# Patient Record
Sex: Female | Born: 1960 | Race: Black or African American | Hispanic: No | Marital: Single | Smoking: Never smoker
Health system: Southern US, Community
[De-identification: ages and names within clinical notes are randomized; demographics above are authoritative.]

## PROBLEM LIST (undated history)

## (undated) DIAGNOSIS — D649 Anemia, unspecified: Secondary | ICD-10-CM

## (undated) DIAGNOSIS — D219 Benign neoplasm of connective and other soft tissue, unspecified: Secondary | ICD-10-CM

## (undated) DIAGNOSIS — I10 Essential (primary) hypertension: Secondary | ICD-10-CM

## (undated) HISTORY — DX: Benign neoplasm of connective and other soft tissue, unspecified: D21.9

## (undated) HISTORY — PX: CYST EXCISION: SHX5701

---

## 2011-05-02 ENCOUNTER — Emergency Department (HOSPITAL_COMMUNITY)
Admission: EM | Admit: 2011-05-02 | Discharge: 2011-05-02 | Disposition: A | Discharge status: Home / Self Care | Payer: Self-pay | Attending: Emergency Medicine | Admitting: Emergency Medicine

## 2011-05-02 ENCOUNTER — Emergency Department (HOSPITAL_COMMUNITY): Payer: Self-pay

## 2011-05-02 DIAGNOSIS — R05 Cough: Secondary | ICD-10-CM | POA: Insufficient documentation

## 2011-05-02 DIAGNOSIS — J329 Chronic sinusitis, unspecified: Secondary | ICD-10-CM | POA: Insufficient documentation

## 2011-05-02 DIAGNOSIS — R059 Cough, unspecified: Secondary | ICD-10-CM | POA: Insufficient documentation

## 2013-03-25 ENCOUNTER — Emergency Department (HOSPITAL_COMMUNITY): Payer: BC Managed Care – PPO

## 2013-03-25 ENCOUNTER — Encounter (HOSPITAL_COMMUNITY): Payer: Self-pay

## 2013-03-25 ENCOUNTER — Emergency Department (HOSPITAL_COMMUNITY)
Admission: EM | Admit: 2013-03-25 | Discharge: 2013-03-25 | Disposition: A | Discharge status: Home / Self Care | Payer: BC Managed Care – PPO | Attending: Emergency Medicine | Admitting: Emergency Medicine

## 2013-03-25 DIAGNOSIS — D219 Benign neoplasm of connective and other soft tissue, unspecified: Secondary | ICD-10-CM

## 2013-03-25 DIAGNOSIS — N949 Unspecified condition associated with female genital organs and menstrual cycle: Secondary | ICD-10-CM | POA: Insufficient documentation

## 2013-03-25 DIAGNOSIS — N938 Other specified abnormal uterine and vaginal bleeding: Secondary | ICD-10-CM | POA: Insufficient documentation

## 2013-03-25 DIAGNOSIS — D649 Anemia, unspecified: Secondary | ICD-10-CM | POA: Insufficient documentation

## 2013-03-25 DIAGNOSIS — Z79899 Other long term (current) drug therapy: Secondary | ICD-10-CM | POA: Insufficient documentation

## 2013-03-25 DIAGNOSIS — D252 Subserosal leiomyoma of uterus: Secondary | ICD-10-CM | POA: Insufficient documentation

## 2013-03-25 DIAGNOSIS — I10 Essential (primary) hypertension: Secondary | ICD-10-CM | POA: Insufficient documentation

## 2013-03-25 LAB — URINALYSIS, ROUTINE W REFLEX MICROSCOPIC
Glucose, UA: NEGATIVE mg/dL
Hgb urine dipstick: NEGATIVE
Specific Gravity, Urine: <1.005 — ABNORMAL LOW (ref 1.005–1.030)

## 2013-03-25 LAB — BASIC METABOLIC PANEL
BUN: 14 mg/dL (ref 6–23)
CO2: 25 mEq/L (ref 19–32)
Calcium: 9.4 mg/dL (ref 8.4–10.5)
Creatinine, Ser: 0.89 mg/dL (ref 0.50–1.10)
Glucose, Bld: 114 mg/dL — ABNORMAL HIGH (ref 70–99)

## 2013-03-25 LAB — CBC WITH DIFFERENTIAL/PLATELET
Eosinophils Relative: 2 % (ref 0–5)
HCT: 23.3 % — ABNORMAL LOW (ref 36.0–46.0)
Lymphocytes Relative: 16 % (ref 12–46)
Lymphs Abs: 1.7 10*3/uL (ref 0.7–4.0)
MCV: 80.1 fL (ref 78.0–100.0)
Monocytes Absolute: 0.8 10*3/uL (ref 0.1–1.0)
RBC: 2.91 MIL/uL — ABNORMAL LOW (ref 3.87–5.11)
RDW: 13.1 % (ref 11.5–15.5)
WBC: 10.9 10*3/uL — ABNORMAL HIGH (ref 4.0–10.5)

## 2013-03-25 LAB — PREGNANCY, URINE: Preg Test, Ur: NEGATIVE

## 2013-03-25 MED ORDER — MEGESTROL ACETATE 40 MG PO TABS: 40.0000 mg | ORAL_TABLET | Freq: Three times a day (TID) | ORAL | Status: DC

## 2013-03-25 MED ORDER — FERROUS SULFATE 325 (65 FE) MG PO TABS: 325.0000 mg | ORAL_TABLET | Freq: Two times a day (BID) | ORAL | Status: AC

## 2013-03-25 NOTE — ED Provider Notes (Signed)
History  This chart was scribed for Anita Lyons, MD by [name], ED Scribe. This patient was seen in room APA10/APA10 and the patient's care was started at 2:09 PM.   CSN: [id number]  Arrival date & time 03/25/13  1341   First MD Initiated Contact with Patient 03/25/13 1409      Chief Complaint  Patient presents with  . Abnormal Lab    The history is provided by the patient. No language interpreter was used.    Anita Mosley is a 52 y.o. female brought in by ambulance from [facility], who presents to the Emergency Department complaining of 2 to 3 months of vaginal bleeding described as heavier than normal that has worsened over the past month with associated one week of fatigue, subjective fevers and chills. She reports having to change pads every hour. Hemoglobin was measured 7.3 in her PCP's officer today and she was advised to come to the ED. She denies having a h/o fibroids and denies being followed by a gynecologist currently. She states that she was being seen by health department but has not had any PAP smears done "in a long while".  She also reports starting a new HTN medication one month ago after being seen for eye swelling. Pt has f/u several times since then after being told that her BP was at "stroke-level" and reports several medications changes. She states that she has an appointment with a cardiologist to be checked out in 3 days.  She denies nausea, emesis and diarrhea as associated symptoms. Pt does not have a h/o chronic medical conditions or prior operations.   Dr. Ethelene Browns at [facility]  History reviewed. No pertinent past medical history.  History reviewed. No pertinent past surgical history.  History reviewed. No pertinent family history.  History  Substance Use Topics  . Smoking status: Not on file  . Smokeless tobacco: Not on file  . Alcohol Use: No    No OB history provided.  Review of Systems  A complete 10 system  review of systems was obtained and all systems are negative except as noted in the HPI and PMH.   Allergies  Aspirin  Home Medications  No current outpatient prescriptions on file.  Triage Vitals: BP 171/67  Pulse 111  Temp(Src) 97.4 F (36.3 C) (Oral)  Resp 18  Ht 5\' 2"  (1.575 m)  Wt 196 lb (88.905 kg)  BMI 35.84 kg/m2  SpO2 99%  LMP 01/25/2013  Physical Exam  Nursing note and vitals reviewed. Constitutional: She is oriented to person, place, and time. She appears well-developed and well-nourished. No distress.  HENT:  Head: Normocephalic and atraumatic.  Eyes: Conjunctivae and EOM are normal.  Neck: Neck supple. No tracheal deviation present.  Cardiovascular: Normal rate and regular rhythm.   No murmur heard. Pulmonary/Chest: Effort normal and breath sounds normal. No respiratory distress.  Abdominal: Soft. There is no tenderness.  Musculoskeletal: Normal range of motion. She exhibits no edema.  Neurological: She is alert and oriented to person, place, and time.  Skin: Skin is warm and dry.  Psychiatric: She has a normal mood and affect. Her behavior is normal.    ED Course  Procedures (including critical care time)  DIAGNOSTIC STUDIES: Oxygen Saturation is 99% on room air, normal by my interpretation.    COORDINATION OF CARE: 2:20 PM-Discussed treatment plan which includes [place], UA, CBC and BMP with pt at bedside and pt agreed to plan.   Labs Reviewed  CBC  WITH DIFFERENTIAL - Abnormal; Notable for the following:    WBC 10.9 (*)    RBC 2.91 (*)    Hemoglobin 7.3 (*)    HCT 23.3 (*)    MCH 25.1 (*)    Platelets 437 (*)    Neutro Abs 8.2 (*)    All other components within normal limits  BASIC METABOLIC PANEL - Abnormal; Notable for the following:    Sodium 133 (*)    Chloride 95 (*)    Glucose, Bld 114 (*)    GFR calc non Af Amer 75 (*)    GFR calc Af Amer 87 (*)    All other components within normal limits  URINALYSIS, ROUTINE W REFLEX MICROSCOPIC -  Abnormal; Notable for the following:    Specific Gravity, Urine <1.005 (*)    All other components within normal limits  PREGNANCY, URINE  TYPE AND SCREEN   US Transvaginal Non-ob  03/25/2013  *RADIOLOGY REPORT*  Clinical Data: Vaginal bleeding.  Abnormal labs.  Hemoglobin is 7.3.  LMP 01/19/2013.  TRANSABDOMINAL AND TRANSVAGINAL ULTRASOUND OF PELVIS Technique:  Both transabdominal and transvaginal ultrasound examinations of the pelvis were performed. Transabdominal technique was performed for global imaging of the pelvis including uterus, ovaries, adnexal regions, and pelvic cul-de-sac.  It was necessary to proceed with endovaginal exam following the transabdominal exam to visualize the cervix and cul-de-sac region, lower uterine segment and endometrial stripe.  Comparison:  None  Findings:  Uterus: Uterus is enlarged, measuring 14.2 x 8.9 x 8.6 cm. Multiple fibroids are identified.  Central fibroid is 4.9 x 3.3 x 4.9 cm and is likely submucosal. The endometrial stripe is displaced anteriorly by this mass. Anterior fibroids are 1.6 x 2.0 x 1.6 cm and 3.7 x 3.2 x 3.6 cm.  Endometrium: 3.8 mm in thickness.  Displaced anteriorly.  Right ovary:  3.5 x 2.0 x 2.9 cm. Normal appearance  Left ovary: 3.2 x 1.5 x 2.0 cm.  Normal appearance.Area  Other findings: No free fluid  IMPRESSION:  1.  Enlarged uterus containing multiple fibroids. 2.  Central fibroid displaces the endometrial stripe anteriorly. 3.  Normal-appearing ovaries.   Original Report Authenticated By: Norva Pavlov, M.D.    US Pelvis Complete  03/25/2013  *RADIOLOGY REPORT*  Clinical Data: Vaginal bleeding.  Abnormal labs.  Hemoglobin is 7.3.  LMP 01/19/2013.  TRANSABDOMINAL AND TRANSVAGINAL ULTRASOUND OF PELVIS Technique:  Both transabdominal and transvaginal ultrasound examinations of the pelvis were performed. Transabdominal technique was performed for global imaging of the pelvis including uterus, ovaries, adnexal regions, and pelvic cul-de-sac.   It was necessary to proceed with endovaginal exam following the transabdominal exam to visualize the cervix and cul-de-sac region, lower uterine segment and endometrial stripe.  Comparison:  None  Findings:  Uterus: Uterus is enlarged, measuring 14.2 x 8.9 x 8.6 cm. Multiple fibroids are identified.  Central fibroid is 4.9 x 3.3 x 4.9 cm and is likely submucosal. The endometrial stripe is displaced anteriorly by this mass. Anterior fibroids are 1.6 x 2.0 x 1.6 cm and 3.7 x 3.2 x 3.6 cm.  Endometrium: 3.8 mm in thickness.  Displaced anteriorly.  Right ovary:  3.5 x 2.0 x 2.9 cm. Normal appearance  Left ovary: 3.2 x 1.5 x 2.0 cm.  Normal appearance.Area  Other findings: No free fluid  IMPRESSION:  1.  Enlarged uterus containing multiple fibroids. 2.  Central fibroid displaces the endometrial stripe anteriorly. 3.  Normal-appearing ovaries.   Original Report Authenticated By: Norva Pavlov, M.D.  No diagnosis found.    MDM  The patient presents here with vaginal bleeding, anemia with Hb 7.3.  I ordered an ultrasound which showed fibroids.  I have discussed this with Dr. Emelda Fear from [facility] who came to the ED to see the patient.  His recommendations are for megace, mvit, and iron supplements.  Will discharge to home with the prescriptions.  To follow up with Dr. Emelda Fear in the office.    I personally performed the services described in this documentation, which was scribed in my presence. The recorded information has been reviewed and is accurate.          Anita Lyons, MD 03/25/13 [id number]

## 2013-03-25 NOTE — ED Notes (Signed)
Pt via [facility] from [facility] due to low hemoglobin. States she has been menstruating for 1 month

## 2013-03-25 NOTE — ED Notes (Addendum)
Pt with vaginal bleeding with large clots per pt x 3 months per pt., denies pain, + dizziness with positional changes

## 2013-03-27 ENCOUNTER — Telehealth: Payer: Self-pay | Admitting: Obstetrics and Gynecology

## 2013-04-04 NOTE — Telephone Encounter (Signed)
Please call patient to speed up referral from  ED where she was seen with anemia and fibriods.  If pt has other f/u plans elsewhere, that's fine, just document.

## 2013-04-12 ENCOUNTER — Encounter: Payer: Self-pay | Admitting: Obstetrics and Gynecology

## 2013-04-12 ENCOUNTER — Ambulatory Visit (INDEPENDENT_AMBULATORY_CARE_PROVIDER_SITE_OTHER): Payer: BC Managed Care – PPO | Admitting: Obstetrics and Gynecology

## 2013-04-12 VITALS — BP 160/68 | Wt 212.2 lb

## 2013-04-12 DIAGNOSIS — N938 Other specified abnormal uterine and vaginal bleeding: Secondary | ICD-10-CM

## 2013-04-12 DIAGNOSIS — D500 Iron deficiency anemia secondary to blood loss (chronic): Secondary | ICD-10-CM

## 2013-04-12 DIAGNOSIS — D649 Anemia, unspecified: Secondary | ICD-10-CM

## 2013-04-12 DIAGNOSIS — N949 Unspecified condition associated with female genital organs and menstrual cycle: Secondary | ICD-10-CM

## 2013-04-12 DIAGNOSIS — D259 Leiomyoma of uterus, unspecified: Secondary | ICD-10-CM

## 2013-04-12 LAB — POCT HEMOGLOBIN: Hemoglobin: 7.4 g/dL — AB (ref 12.2–16.2)

## 2013-04-12 NOTE — Patient Instructions (Addendum)
.    You have info on fibriods

## 2013-04-12 NOTE — Addendum Note (Signed)
Addended by: Colen Darling on: 04/12/2013 01:26 PM   Modules accepted: Orders

## 2013-04-12 NOTE — Progress Notes (Addendum)
  Subjective:    Anita Mosley is a 52 y.o. female who presents with uterine fibroids. Periods are light ,were heavy until seen in ED, lasting several days. Dysmenorrhea:none. Occasional intermenstrual bleeding, spotting, or discharge.  Current contraception: none abstinence History of abnormal Pap smear: no Family history of uterine or ovarian cancer: no Regular self breast exam: yes History of abnormal mammogram: never obtained Family history of breast cancer: yes - mother History of abnormal lipids: no Menstrual History: OB History   Grav Para Term Preterm Abortions TAB SAB Ect Mult Living                  Menarche age:  Patient's last menstrual period was 01/25/2013.      Review of Systems Pertinent items are noted in HPI.    Objective:     BP 160/68  Wt 212 lb 3.2 oz (96.253 kg)  BMI 38.8 kg/m2  LMP 01/25/2013 BP 160/68  Wt 212 lb 3.2 oz (96.253 kg)  BMI 38.8 kg/m2  LMP 01/25/2013 General appearance: alert and no distress Abdomen: abnormal findings:  fibroid enlargement Pelvic: prolapsed endometrial polyp, consent obtained verbally, removed easily  7 cm Extremities: extremities normal, atraumatic, no cyanosis or edema    Assessment:    Symptomatic uterine fibroids.  Endometrial Polyp, removed   Plan:  followup tissue, obtain pap at [facility], return here 1 month for next plan.   Will need to retest for GC/Chl as todays specimen  Not processed due to collection error

## 2013-04-15 LAB — GC/CHLAMYDIA PROBE AMP

## 2013-04-23 ENCOUNTER — Ambulatory Visit (INDEPENDENT_AMBULATORY_CARE_PROVIDER_SITE_OTHER): Payer: BC Managed Care – PPO | Admitting: Obstetrics and Gynecology

## 2013-04-23 DIAGNOSIS — Z01818 Encounter for other preprocedural examination: Secondary | ICD-10-CM

## 2013-04-23 DIAGNOSIS — D259 Leiomyoma of uterus, unspecified: Secondary | ICD-10-CM

## 2013-04-24 LAB — GC/CHLAMYDIA PROBE AMP: CT Probe RNA: NEGATIVE

## 2013-05-06 ENCOUNTER — Encounter: Payer: Self-pay | Admitting: *Deleted

## 2013-05-06 NOTE — Telephone Encounter (Signed)
Error

## 2013-05-10 ENCOUNTER — Other Ambulatory Visit: Payer: Self-pay | Admitting: Obstetrics and Gynecology

## 2013-05-10 ENCOUNTER — Encounter: Payer: Self-pay | Admitting: Obstetrics and Gynecology

## 2013-05-10 ENCOUNTER — Ambulatory Visit (INDEPENDENT_AMBULATORY_CARE_PROVIDER_SITE_OTHER): Payer: BC Managed Care – PPO | Admitting: Obstetrics and Gynecology

## 2013-05-10 ENCOUNTER — Other Ambulatory Visit (HOSPITAL_COMMUNITY)
Admission: RE | Admit: 2013-05-10 | Discharge: 2013-05-10 | Disposition: A | Discharge status: Home / Self Care | Payer: BC Managed Care – PPO | Source: Ambulatory Visit | Attending: Obstetrics and Gynecology | Admitting: Obstetrics and Gynecology

## 2013-05-10 VITALS — BP 130/68 | Ht 61.0 in | Wt 212.4 lb

## 2013-05-10 DIAGNOSIS — Z1151 Encounter for screening for human papillomavirus (HPV): Secondary | ICD-10-CM | POA: Insufficient documentation

## 2013-05-10 DIAGNOSIS — Z01419 Encounter for gynecological examination (general) (routine) without abnormal findings: Secondary | ICD-10-CM | POA: Insufficient documentation

## 2013-05-10 DIAGNOSIS — Z3200 Encounter for pregnancy test, result unknown: Secondary | ICD-10-CM

## 2013-05-10 DIAGNOSIS — N938 Other specified abnormal uterine and vaginal bleeding: Secondary | ICD-10-CM

## 2013-05-10 DIAGNOSIS — Z3202 Encounter for pregnancy test, result negative: Secondary | ICD-10-CM

## 2013-05-10 DIAGNOSIS — N939 Abnormal uterine and vaginal bleeding, unspecified: Secondary | ICD-10-CM

## 2013-05-10 DIAGNOSIS — N949 Unspecified condition associated with female genital organs and menstrual cycle: Secondary | ICD-10-CM

## 2013-05-10 NOTE — Patient Instructions (Signed)
Results in 1 week on biopsy

## 2013-05-27 ENCOUNTER — Telehealth: Payer: Self-pay | Admitting: Obstetrics and Gynecology

## 2013-05-28 NOTE — Telephone Encounter (Signed)
Pt states been having nausea and weakness, vaginal bleeding with clots since procedure/endometrial biopsy with Dr. Emelda Fear. Appointment made with Dr. Emelda Fear tomorrow at 9 am.

## 2013-05-28 NOTE — Telephone Encounter (Signed)
Spoke with Joaquin Courts pt niece, she stated would have pt call office back to discuss questions and concerns.

## 2013-05-29 ENCOUNTER — Ambulatory Visit (INDEPENDENT_AMBULATORY_CARE_PROVIDER_SITE_OTHER): Payer: BC Managed Care – PPO | Admitting: Obstetrics and Gynecology

## 2013-05-29 ENCOUNTER — Ambulatory Visit: Payer: BC Managed Care – PPO | Admitting: Obstetrics and Gynecology

## 2013-05-29 ENCOUNTER — Encounter: Payer: Self-pay | Admitting: Obstetrics and Gynecology

## 2013-05-29 VITALS — BP 120/60 | Ht 61.0 in | Wt 212.0 lb

## 2013-05-29 DIAGNOSIS — N939 Abnormal uterine and vaginal bleeding, unspecified: Secondary | ICD-10-CM

## 2013-05-29 DIAGNOSIS — D259 Leiomyoma of uterus, unspecified: Secondary | ICD-10-CM

## 2013-05-29 DIAGNOSIS — D649 Anemia, unspecified: Secondary | ICD-10-CM

## 2013-05-29 DIAGNOSIS — N921 Excessive and frequent menstruation with irregular cycle: Secondary | ICD-10-CM | POA: Insufficient documentation

## 2013-05-29 DIAGNOSIS — N920 Excessive and frequent menstruation with regular cycle: Secondary | ICD-10-CM

## 2013-05-29 DIAGNOSIS — N898 Other specified noninflammatory disorders of vagina: Secondary | ICD-10-CM

## 2013-05-29 MED ORDER — MEGESTROL ACETATE 40 MG PO TABS: 40.0000 mg | ORAL_TABLET | Freq: Three times a day (TID) | ORAL | Status: DC

## 2013-05-29 NOTE — Progress Notes (Signed)
  Assessment:  persistent menometrorrhagia due to fibroids, submucosal Anemia, severe secondary to the menorrhagia Desire to avoid transfusion Desire for hysterectomy   Plan:  counseled on treatment with Megace to suppress bleeding til she can build up Hgb for surgery Rx: Megace  40 mg 3 times a day until bleeding stops then once daily until anemia recovered and surgery to be scheduled return visit 2 weeks 23 June -Pt to call if not dramatically reduced bleeding by 1 wk, will move forward with surgery and transfusions. Subjective:  KHYLI SWAIM is a 52 y.o. female who has been referred here for bleeding due to fibroids and endometrial polyp. Polyp was excised 4/25 [facility], and followup 5/6 reviewed path, performed GC/Chl, reviewed u/s from hospital showing fibroids intra mural and submucosal, sufficient to interfere with endometrium. She was treated with megace to attempt to control the endometrium  On 5/25, followup involved completion of Pap smear and endometrial biopsy.   Today, 6/11, she presents for followup of the persistent heavy bleeding associated with fibroids that has increased recently once again.  She notices symptoms of anemia, increased fatigue with exertion. She still wished to avoid transfusions: we discussed Megace, which helped before, and/or lupron as well as transfusing pt and proceeding to hysterectomy.  The following portions of the patient's history were reviewed and updated as appropriate: allergies, current medications, past medical & surgical history, & past family history.   Patient's mother had breast cancer at premenopausal age patient strongly advised to begin getting mammograms    Review of Systems Pertinent items are noted in HPI. Breast:Negative for breast lump,nipple discharge or nipple retraction Gastrointestinal: Negative for abdominal pain, change in bowel habits or rectal bleeding GU: Negative for dysuria, frequency, urgency or incontinence.   GYN:  Patient's last menstrual period was 05/10/2013.  continued bleeding to this date On 5/23 she had pa Objective:  BP 120/60  Ht 5\' 1"  (1.549 m)  Wt 212 lb (96.163 kg)  BMI 40.08 kg/m2  LMP 05/10/2013    BMI: Body mass index is 40.08 kg/(m^2).  General Appearance: Alert, appropriate appearance for age. 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 S1, S2, no murmur Lungs: Clear to auscultation bilaterally Back: No CVA tenderness Gastrointestinal: Soft, non-tender, no masses or organomegaly Pelvic Exam: Exam deferred. External genitalia: normal general appearance Vaginal: normal rugae, presence of blood and And clots Cervix: normal appearance Adnexa: Uterus enlarged, deep and pelvis, with no adnexal tenderness. Estimated uterine size 12 weeks Uterus: mid-position, enlarged and 12 week Good support not a vaginal hysterectomy candidate Rectovaginal: not indicated  Tilda Burrow MD

## 2013-05-29 NOTE — Progress Notes (Deleted)
   [facility]  Patient name: Anita Mosley MRN [id number]  Date of birth: [birth year]  CC & HPI:  Anita Mosley is a 52 y.o. female presenting today for ***  ROS:  ***  Pertinent History Reviewed:  Medical & Surgical Hx:  Reviewed: Significant for *** Medications: Reviewed & Updated - see associated section Social History: Reviewed -  reports that she has never smoked. She does not have any smokeless tobacco history on file.  Objective Findings:  Vitals: BP 130/68  Ht 5\' 1"  (1.549 m)  Wt 212 lb 6.4 oz (96.344 kg)  BMI 40.15 kg/m2  LMP 05/10/2013  Physical Examination: {female adult master:[id number]}  ***  Assessment & Plan:

## 2013-05-29 NOTE — Patient Instructions (Signed)
Take Megace 3 times daily until the bleeding stops. Please call our office and let us know if bleeding does not stop within a week there is an injection called Lupron that may work if the Megace is in effective

## 2013-05-30 NOTE — Progress Notes (Signed)
   Assessment:   persistent menometrorrhagia due to fibroids, submucosal  Anemia, severe secondary to the menorrhagia  Desire to avoid transfusion  Desire for hysterectomy  Plan:  Followup Biopsy and Pap  By appt to finalize surgery plans Continue iron, vitamins   Subjective:  Anita Mosley is a 52 y.o. female, No obstetric history on file., who presents for pap and endometrial biopsy, as completion of preop w/u Review of records did not id a recent pap.  The following portions of the patient's history were reviewed and updated as appropriate: allergies, current medications, past medical & surgical history, & past family history.   There is no significant family history of breast or ovarian cancer.    Review of Systems Pertinent items are noted in HPI. Breast:Negative for breast lump,nipple discharge or nipple retraction Gastrointestinal: Negative for abdominal pain, change in bowel habits or rectal bleeding GU: Negative for dysuria, frequency, urgency or incontinence.   GYN: Patient's last menstrual period was 05/10/2013.   Objective:  BP 130/68  Ht 5\' 1"  (1.549 m)  Wt 212 lb 6.4 oz (96.344 kg)  BMI 40.15 kg/m2  LMP 05/10/2013    BMI: Body mass index is 40.15 kg/(m^2).  General Appearance: Alert, appropriate appearance for age. No acute distress HEENT: Grossly normal Neck / Thyroid: Supple, no masses, nodes or enlargement Cardiovascular: Regular rate and rhythm. S1, S2, no murmur Lungs: Clear to auscultation bilaterally Back: No CVA tenderness Gastrointestinal: Soft, non-tender, no masses or organomegaly Pelvic Exam: Vaginal: normal mucosa without prolapse or lesions, presence of blood and minimal blood this date Cervix: normal appearance and presence of blood from cervical os Adnexa: normal bimanual exam Uterus: retroverted, enlarged, irregular enlargement and sounded to 10 cm for endometrial biopsy . Rectovaginal: not indicated  Tilda Burrow MD  Endometrial  Biopsy: Patient given informed consent, signed copy in the chart, time out was performed. Time out taken. . The patient was placed in the lithotomy position and the cervix brought into view with sterile speculum.  Portio of cervix cleansed x 2 with betadine swabs.  A tenaculum was placed in the anterior lip of the cervix. The uterus was sounded for depth of 10 cm,. Milex uterine Explora 3 mm was introduced to into the uterus, suction created,  and an endometrial sample was obtained. All equipment was removed and accounted for.   The patient tolerated the procedure well.    Patient given post procedure instructions. The patient will return in 2 weeks for results.

## 2013-06-03 ENCOUNTER — Ambulatory Visit: Payer: BC Managed Care – PPO | Admitting: Obstetrics and Gynecology

## 2013-06-10 ENCOUNTER — Encounter: Payer: Self-pay | Admitting: Obstetrics and Gynecology

## 2013-06-10 ENCOUNTER — Ambulatory Visit (INDEPENDENT_AMBULATORY_CARE_PROVIDER_SITE_OTHER): Payer: BC Managed Care – PPO | Admitting: Obstetrics and Gynecology

## 2013-06-10 VITALS — BP 160/70 | Ht 62.0 in | Wt 211.1 lb

## 2013-06-10 DIAGNOSIS — D259 Leiomyoma of uterus, unspecified: Secondary | ICD-10-CM

## 2013-06-10 DIAGNOSIS — D649 Anemia, unspecified: Secondary | ICD-10-CM

## 2013-06-10 LAB — POCT HEMOGLOBIN: Hemoglobin: 9.1 g/dL — AB (ref 12.2–16.2)

## 2013-06-10 NOTE — Patient Instructions (Addendum)
Continue iron and vitamins until we see her back in 2 weeks. Surgery will be 29 July and will be a hysterectomy with removal of the tubes. We will save the ovaries. The cervix will be removed. If you have any concerns or feel the need to change the surgery , let us know. Please review the information regarding hysterectomy

## 2013-06-10 NOTE — Progress Notes (Signed)
   [facility] Visit  Patient name: Anita Mosley MRN [id number]  Date of birth: 1961-10-21  CC & HPI:  Anita Mosley is a 52 y.o. female presenting today for followup of endometrial biopsy, which was benign and Pap smear which has returned normal she is status post removal of parotid tumor on 05/30/2013 at [facility] and is healing from that surgery. The parotid tumor was benign we will delay hysterectomy until she's had time to recover for a little but further from the parotid tumor see her back in 2 weeks. Bleeding is much improved on Megace. We'll keep her  on Megace until the surgery. We have discussed surgical types and we'll proceed with total abdominal hysterectomy with bilateral salpingectomy, and we'll leave both ovaries in place. The family and the patient desired the surgical schedule for July 29. She no longer is interested in avoiding a hysterectomy I think is necessary  ROS:  Healing well status post parotid tumor excision  Pertinent History Reviewed:  Medical & Surgical Hx:  Reviewed: Significant for surgery July 9 [place] left parotid tumor Medications: Reviewed & Updated - see associated section Social History: Reviewed -  reports that she has never smoked. She does not have any smokeless tobacco history on file.  Objective Findings:  Vitals: BP 160/70  Ht 5\' 2"  (1.575 m)  Wt 211 lb 1.6 oz (95.754 kg)  BMI 38.6 kg/m2  LMP 05/10/2013  Physical Examination: General appearance - alert, well appearing, and in no distress, oriented to person, place, and time and overweight Mental status - alert, oriented to person, place, and time, normal mood, behavior, speech, dress, motor activity, and thought processes  HEENT: Z-plasty incision in front and around the left ear, sutures present, healing well but obviously early in healing process  Assessment & Plan:   Uterine fibroids with secondary menometrorrhagia Submucosal uterine fibroid Anemia Satisfactory  response to Megace Status post parotid tumor excision July 9  Plan: Check hemoglobin today, hemoglobin fingerstick 9.1 improved from 7 earlier Allow patient to come back in 2 weeks for full preop examination Abdominal hysterectomy with salpingectomy, preservation of ovaries and removal of cervix scheduled for July 29 Patient taken to office scheduler, Dawn Little for scheduling of surgery

## 2013-06-20 ENCOUNTER — Encounter: Payer: Self-pay | Admitting: Obstetrics and Gynecology

## 2013-06-20 NOTE — Progress Notes (Signed)
   [facility] Visit  Patient name: LAMIAH MARMOL MRN [id number]  Date of birth: [birth year]  CC & HPI:  CHARRISE LARDNER is a 52 y.o. female presenting today for followup of last visit. Need repeat of GC /Chl,  Specimen was not processed correctly at last visit  ROS:  See prior note  Pertinent History Reviewed:  Medical & Surgical Hx:  Reviewed: Significant for  Medications: Reviewed & Updated - see associated section Social History: Reviewed -  reports that she has never smoked. She does not have any smokeless tobacco history on file.  Objective Findings:  Vitals: LMP 01/25/2013  Physical Examination: Pelvic - normal external genitalia, vulva, vagina, cervix, uterus and adnexa, VULVA: normal appearing vulva with no masses, tenderness or lesions Uterine fibroids 14 weeks size   Assessment & Plan:  A: Uterine fibroids 14 wks       Need to recheck gc/chl F/u GC/Chl Return for endometrial biopsy Complete preop w/u

## 2013-06-24 ENCOUNTER — Ambulatory Visit (INDEPENDENT_AMBULATORY_CARE_PROVIDER_SITE_OTHER): Payer: BC Managed Care – PPO | Admitting: Obstetrics and Gynecology

## 2013-06-24 ENCOUNTER — Encounter: Payer: Self-pay | Admitting: Obstetrics and Gynecology

## 2013-06-24 VITALS — BP 140/80 | Ht 62.0 in | Wt 211.6 lb

## 2013-06-24 DIAGNOSIS — D259 Leiomyoma of uterus, unspecified: Secondary | ICD-10-CM

## 2013-06-24 DIAGNOSIS — D649 Anemia, unspecified: Secondary | ICD-10-CM

## 2013-06-24 NOTE — Progress Notes (Signed)
   [facility] Visit  Patient name: Anita Mosley MRN [id number]  Date of birth: 1961-09-04  CC & HPI:  Anita Mosley is a 52 y.o. female presenting today for preOP. Scheduled Total abd hyst for 29 July. Pt has healed nicely s/p recent parotid tumor removal at [facility] ROS:  Patient is here with her niece, who is the disiplined one The patient likes to have an anxiety reaction and put off surgery medical interventions. She put off the parotid surgery for 2 years, and described herself as someone who does not want any pain.  Pertinent History Reviewed:  Medical & Surgical Hx:  Reviewed: Significant for status post parotid tumor excision, healing well Medications: Reviewed & Updated - see associated section Social History: Reviewed -  reports that she has never smoked. She does not have any smokeless tobacco history on file.  Objective Findings:  Vitals: BP 140/80  Ht 5\' 2"  (1.575 m)  Wt 211 lb 9.6 oz (95.981 kg)  BMI 38.69 kg/m2  LMP 05/10/2013  Physical Examination: General appearance - alert, well appearing, and in no distress, oriented to person, place, and time, overweight and anxious Mental status - alert, oriented to person, place, and time, affect appropriate to mood Ears - bilateral TM's and external ear canals normal, right ear normal, skin around left ear healing nicely after excision of parotid tumor Mouth - mucous membranes moist, pharynx normal without lesions Neck - supple, no significant adenopathy, healing nicely around left ear Chest: Clear to auscultation Abdomen: Obese without masses Pelvic: External genitalia: Normal female appropriate for age           Vaginal normal secretions cervix normal in appearance           Uterus: Exam limited obesity, uterus feels approximately 10-12 week size nontender nonpurulent Adnexa limited by obesity, no adnexal pathology identified. Prior ultrasound showed normal left and right ovary.   Assessment & Plan:   Uterine fibroids, including submucosal fibroid deforming endometrial cavity Benign endometrium and benign endometrial polyp on prior biopsy Anemia, recovering, currently hemoglobin 9.9 Plan: Proceed toward total abdominal hysterectomy bilateral salpingectomy as scheduled 07/16/2013

## 2013-06-24 NOTE — H&P (Signed)
  CC & HPI:   Anita Mosley is a 52 y.o. female presenting today for preOP.  Scheduled Total abd hyst for 29 July.  Pt has healed nicely s/p recent parotid tumor removal at [facility]   ROS:   Patient is here with her niece, who is the disiplined one The patient likes to have an anxiety reaction and put off surgery medical interventions. She put off the parotid surgery for 2 years, and described herself as someone who does not want any pain.   Pertinent History Reviewed:   Medical & Surgical Hx: Reviewed: Significant for status post parotid tumor excision, healing well  Medications: Reviewed & Updated - see associated section  Social History: Reviewed - reports that she has never smoked. She does not have any smokeless tobacco history on file.   Objective Findings:   Vitals: BP 140/80  Ht 5\' 2"  (1.575 m)  Wt 211 lb 9.6 oz (95.981 kg)  BMI 38.69 kg/m2  LMP 05/10/2013  Physical Examination: General appearance - alert, well appearing, and in no distress, oriented to person, place, and time, overweight and anxious  Mental status - alert, oriented to person, place, and time, affect appropriate to mood  Ears - bilateral TM's and external ear canals normal, right ear normal, skin around left ear healing nicely after excision of parotid tumor  Mouth - mucous membranes moist, pharynx normal without lesions  Neck - supple, no significant adenopathy, healing nicely around left ear  Chest: Clear to auscultation  Abdomen: Obese without masses  Pelvic: External genitalia: Normal female appropriate for age  Vaginal normal secretions cervix normal in appearance  Uterus: Exam limited obesity, uterus feels approximately 10-12 week size nontender nonpurulent  Adnexa limited by obesity, no adnexal pathology identified. Prior ultrasound showed normal left and right ovary.   Assessment & Plan:   Uterine fibroids, including submucosal fibroid deforming endometrial cavity  Benign endometrium and benign  endometrial polyp on prior biopsy  Anemia, recovering, currently hemoglobin 9.9  Plan: Proceed toward total abdominal hysterectomy bilateral salpingectomy as scheduled 07/16/2013

## 2013-06-24 NOTE — Patient Instructions (Signed)
Keep preop appt as scheduled

## 2013-06-27 ENCOUNTER — Other Ambulatory Visit (HOSPITAL_COMMUNITY): Payer: BC Managed Care – PPO

## 2013-07-10 ENCOUNTER — Other Ambulatory Visit: Payer: Self-pay | Admitting: Obstetrics and Gynecology

## 2013-07-10 ENCOUNTER — Encounter (HOSPITAL_COMMUNITY): Payer: Self-pay | Admitting: Pharmacy Technician

## 2013-07-10 NOTE — H&P (Signed)
Anita Mosley is an 52 y.o. female. Anita Mosley is scheduled for abdominal hysterectomy and bilateral salpingectomy with preservation of the ovaries on 07/16/2013. The patient has experienced severe anemia due to heavy menses which is attributed to the large submucosal fibroid which is deforming the endometrial cavity and making lesser procedures such as endometrial ablation unlikely to be effective Bleeding has been controlled with the use of Megace, endometrial biopsy has been performed, showing benign endometrium. Pap smear is normal, 05/10/2013. The pros and cons of ovarian preservation have been discussed. The uterus is enlarged with 14.2 x 8.9 x 8.6 cm uterine size with multiple fibroids including a 5 cm central fibroid submucosal in location. Ovaries are normal in appearance bilateral Surgery has been delayed well patient recovered her anemia, and recovered from her recent parotid surgery on the left at [facility].  Pertinent Gynecological History: Menses: flow is excessive with use of Both pads or tampons on heaviest days Bleeding: Excessive heavy menstrual flow Contraception: abstinence DES exposure: unknown Blood transfusions: Transfused prior to parotid surgery as well as after the surgery at [facility] Sexually transmitted diseases: no past history and negative GC Chlamydia culture in May Previous GYN Procedures: Benign endometrial biopsy in 05/10/2013  Last mammogram:  Date:  Last pap: normal Date: May 2014 OB History: G, P   Menstrual History: Menarche age:  Patient's last menstrual period was 05/10/2013.    Past Medical History  Diagnosis Date  . Fibroids     No past surgical history on file.  Family History  Problem Relation Age of Onset  . Cancer Mother     breast  . Diabetes Father   . Hypertension Father     Social History:  reports that she has never smoked. She does not have any smokeless tobacco history on file. She reports that she does not drink alcohol or  use illicit drugs.  Allergies:  Allergies  Allergen Reactions  . Aspirin Diarrhea and Other (See Comments)    Fever, upset stomach  . Mushroom Extract Complex Diarrhea and Other (See Comments)    Fever, upset stomach     (Not in a hospital admission)  ROS  Last menstrual period 05/10/2013. Physical Exam  Physical Examination: General appearance - alert, well appearing, and in no distress, oriented to person, place, and time and anxious Mental status - alert, oriented to person, place, and time, anxious, acknowledges understanding of planned surgery and need for hysterectomy Eyes - pupils equal and reactive, extraocular eye movements intact Neck - supple, no significant adenopathy, well-healed left side of face and neck status post recent parotid surgery at [facility] Chest - clear to auscultation, no wheezes, rales or rhonchi, symmetric air entry Heart - normal rate and regular rhythm Abdomen - soft, nontender, nondistended, no masses or organomegaly Obese Pelvic - VULVA: normal appearing vulva with no masses, tenderness or lesions, VAGINA: normal appearing vagina with normal color and discharge, no lesions, CERVIX: normal appearing cervix without discharge or lesions, UTERUS: anteverted, enlarged to 12-14 weeks size, irregular, ADNEXA: normal adnexa in size, nontender and no masses, fibroid uterus limits exam but ultrasound has been performed and shows normal ovaries, PAP: Normal in May Extremities - peripheral pulses normal, no pedal edema, no clubbing or cyanosis  No results found for this or any previous visit (from the past 24 hour(s)).  No results found.  Assessment/Plan: 1 symptomatic uterine fibroids, submucosal in location with subsequent menometrorrhagia and anemia 2. Hypertension 3. Obesity 4. Status post benign  parotid tumor removal  Plan: Total abdominal hysterectomy with removal of cervix and removal of both tubes, with planned ovarian  preservation.  Kevontay Burks V 07/10/2013, 7:31 AM

## 2013-07-11 ENCOUNTER — Encounter (HOSPITAL_COMMUNITY)
Admission: RE | Admit: 2013-07-11 | Discharge: 2013-07-11 | Disposition: A | Discharge status: Home / Self Care | Payer: BC Managed Care – PPO | Source: Ambulatory Visit | Attending: Obstetrics and Gynecology | Admitting: Obstetrics and Gynecology

## 2013-07-11 ENCOUNTER — Encounter (HOSPITAL_COMMUNITY): Payer: Self-pay

## 2013-07-11 DIAGNOSIS — Z01810 Encounter for preprocedural cardiovascular examination: Secondary | ICD-10-CM | POA: Insufficient documentation

## 2013-07-11 DIAGNOSIS — Z01812 Encounter for preprocedural laboratory examination: Secondary | ICD-10-CM | POA: Insufficient documentation

## 2013-07-11 HISTORY — DX: Essential (primary) hypertension: I10

## 2013-07-11 HISTORY — DX: Anemia, unspecified: D64.9

## 2013-07-11 LAB — COMPREHENSIVE METABOLIC PANEL
ALT: 22 U/L (ref 0–35)
Albumin: 3.7 g/dL (ref 3.5–5.2)
Calcium: 10.5 mg/dL (ref 8.4–10.5)
GFR calc Af Amer: 56 mL/min — ABNORMAL LOW (ref >90)
Glucose, Bld: 86 mg/dL (ref 70–99)
Sodium: 137 mEq/L (ref 135–145)
Total Protein: 7.7 g/dL (ref 6.0–8.3)

## 2013-07-11 LAB — CBC
Hemoglobin: 11.5 g/dL — ABNORMAL LOW (ref 12.0–15.0)
MCH: 26.4 pg (ref 26.0–34.0)
MCHC: 32.4 g/dL (ref 30.0–36.0)
Platelets: 392 10*3/uL (ref 150–400)
RDW: 14.7 % (ref 11.5–15.5)

## 2013-07-11 LAB — URINALYSIS, ROUTINE W REFLEX MICROSCOPIC
Nitrite: NEGATIVE
Specific Gravity, Urine: 1.025 (ref 1.005–1.030)
Urobilinogen, UA: 0.2 mg/dL (ref 0.0–1.0)

## 2013-07-11 LAB — HCG, SERUM, QUALITATIVE: Preg, Serum: NEGATIVE

## 2013-07-11 NOTE — Patient Instructions (Addendum)
Anita Mosley  07/11/2013   Your procedure is scheduled on:  07/16/2013   Report to [facility] at  615  AM.  Call this number if you have problems the morning of surgery: [phone]   Remember:   Do not eat food or drink liquids after midnight.   Take these medicines the morning of surgery with A SIP OF WATER:  Prinizide,claitin, atenolol   Do not wear jewelry, make-up or nail polish.  Do not wear lotions, powders, or perfumes.   Do not shave 48 hours prior to surgery. Men may shave face and neck.  Do not bring valuables to the hospital.  [facility] is not responsible for any belongings or valuables.  Contacts, dentures or bridgework may not be worn into surgery.  Leave suitcase in the car. After surgery it may be brought to your room.  For patients admitted to the hospital, checkout time is 11:00 AM the day of discharge.   Patients discharged the day of surgery will not be allowed to drive home.  Name and phone number of your driver: family  Special Instructions: Shower using CHG 2 nights before surgery and the night before surgery.  If you shower the day of surgery use CHG.  Use special wash - you have one bottle of CHG for all showers.  You should use approximately 1/3 of the bottle for each shower.   Please read over the following fact sheets that you were given: Pain Booklet, Coughing and Deep Breathing, MRSA Information, Surgical Site Infection Prevention, Anesthesia Post-op Instructions and Care and Recovery After Surgery Salpingectomy Salpingectomy, also called tubectomy, is the removal of one of the fallopian tubes with surgery. The fallopian tubes are the tubes that are connected to the uterus. These tubes transport the egg from the ovary to the womb (uterus). Removing one tube does not prevent you from becoming pregnant. Salpingectomy does not have any adverse affects on your menstrual periods. There are many reasons to have a salpingectomy, such as if:  You have a  tubal (ectopic) pregnancy. This is especially true if the tube ruptures.  You have an infected tube.  It is necessary to remove the tube when removing an ovary with a cyst or tumor.  The uterus needs to be removed.  You need surgery for cancer of the tube or other female organs. LET YOUR CAREGIVER KNOW ABOUT:  Allergies to foods or medications.  All the medications you are taking. This includes over-the-counter and prescription drugs, herbs, eye drops, creams and steroids.  If you are using illegal drugs or drinking alcohol.  Your smoking habits.  Previous problems with anesthesia including numbing medication.  The possibility of being pregnant.  History of blood clotting or other blood problems.  Previous surgery.  Other medical or health problems. RISKS AND COMPLICATIONS   Injury to surrounding organs.  Bleeding.  Infection.  The surgery does not correct the problem.  You have problems with the anesthesia. BEFORE THE PROCEDURE  Do not take aspirin or blood thinners. They can cause bleeding.  Do not eat or drink anything at least 8 hours before the surgery.  Let your caregiver know if you develop a cold or an infection.  If you are being admitted the day of surgery, arrive at least one hour before the surgery to read and sign the necessary forms and consents.  Arrange for help when you go home from the hospital.  If you smoke, do not  smoke for at least 2 weeks before the surgery. PROCEDURE  You will be given an IV (intravenous) and a medication to relax you. Then, you will be put to sleep with a drug (anesthetic). Any hair on your lower belly (abdomen) will be removed and a catheter will be placed in your bladder. Through 2 very small cuts (incisions) if you have a laparoscopy, or a large incision in the lower abdomen, the fallopian tube will be removed from where it attaches to the uterus. The blood vessels will be clamped and tied.  AFTER THE PROCEDURE   You  will be taken to the recovery room and observed for 1 to 3 hours.  If you had a laparoscopy, you may be discharged after several hours.  If you had a large incision, you will be admitted to the hospital for a couple of days.  If you had a laparoscopy, you may have shoulder pain. This is not unusual and is from some air that is left in the abdomen. This air affects the nerve that goes from the diaphragm to the shoulder. It goes away in a day or two.  You will be given pain medication if needed.  The intravenous and catheter will be removed before you are discharged.  Have someone available to take you home. Document Released: 04/23/2009 Document Revised: 02/27/2012 Document Reviewed: 04/23/2009 [facility] Patient Information 2014 [place], [place]. Hysterectomy Information  A hysterectomy is a procedure where your uterus is surgically removed. It will no longer be possible to have menstrual periods or to become pregnant. The tubes and ovaries can be removed (bilateral salpingo-oopherectomy) during this surgery as well.  REASONS FOR A HYSTERECTOMY  Persistent, abnormal bleeding.  Lasting (chronic) pelvic pain or infection.  The lining of the uterus (endometrium) starts growing outside the uterus (endometriosis).  The endometrium starts growing in the muscle of the uterus (adenomyosis).  The uterus falls down into the vagina (pelvic organ prolapse).  Symptomatic uterine fibroids.  Precancerous cells.  Cervical cancer or uterine cancer. TYPES OF HYSTERECTOMIES  Supracervical hysterectomy. This type removes the top part of the uterus, but not the cervix.  Total hysterectomy. This type removes the uterus and cervix.  Radical hysterectomy. This type removes the uterus, cervix, and the fibrous tissue that holds the uterus in place in the pelvis (parametrium). WAYS A HYSTERECTOMY CAN BE PERFORMED  Abdominal hysterectomy. A large surgical cut (incision) is made in the abdomen. The uterus  is removed through this incision.  Vaginal hysterectomy. An incision is made in the vagina. The uterus is removed through this incision. There are no abdominal incisions.  Conventional laparoscopic hysterectomy. A thin, lighted tube with a camera (laparoscope) is inserted into 3 or 4 small incisions in the abdomen. The uterus is cut into small pieces. The small pieces are removed through the incisions, or they are removed through the vagina.  Laparoscopic assisted vaginal hysterectomy (LAVH). Three or four small incisions are made in the abdomen. Part of the surgery is performed laparoscopically and part vaginally. The uterus is removed through the vagina.  Robot-assisted laparoscopic hysterectomy. A laparoscope is inserted into 3 or 4 small incisions in the abdomen. A computer-controlled device is used to give the surgeon a 3D image. This allows for more precise movements of surgical instruments. The uterus is cut into small pieces and removed through the incisions or removed through the vagina. RISKS OF HYSTERECTOMY   Bleeding and risk of blood transfusion. Tell your caregiver if you do not want to  receive any blood products.  Blood clots in the legs or lung.  Infection.  Injury to surrounding organs.  Anesthesia problems or side effects.  Conversion to an abdominal hysterectomy. WHAT TO EXPECT AFTER A HYSTERECTOMY  You will be given pain medicine.  You will need to have someone with you for the first 3 to 5 days after you go home.  You will need to follow up with your surgeon in 2 to 4 weeks after surgery to evaluate your progress.  You may have early menopause symptoms like hot flashes, night sweats, and insomnia.  If you had a hysterectomy for a problem that was not a cancer or a condition that could lead to cancer, then you no longer need Pap tests. However, even if you no longer need a Pap test, a regular exam is a good idea to make sure no other problems are  starting. Document Released: 05/31/2001 Document Revised: 02/27/2012 Document Reviewed: 07/16/2011 [facility] Patient Information 2014 [place], [place]. PATIENT INSTRUCTIONS POST-ANESTHESIA  IMMEDIATELY FOLLOWING SURGERY:  Do not drive or operate machinery for the first twenty four hours after surgery.  Do not make any important decisions for twenty four hours after surgery or while taking narcotic pain medications or sedatives.  If you develop intractable nausea and vomiting or a severe headache please notify your doctor immediately.  FOLLOW-UP:  Please make an appointment with your surgeon as instructed. You do not need to follow up with anesthesia unless specifically instructed to do so.  WOUND CARE INSTRUCTIONS (if applicable):  Keep a dry clean dressing on the anesthesia/puncture wound site if there is drainage.  Once the wound has quit draining you may leave it open to air.  Generally you should leave the bandage intact for twenty four hours unless there is drainage.  If the epidural site drains for more than 36-48 hours please call the anesthesia department.  QUESTIONS?:  Please feel free to call your physician or the hospital operator if you have any questions, and they will be happy to assist you.

## 2013-07-15 ENCOUNTER — Encounter: Payer: BC Managed Care – PPO | Admitting: Obstetrics and Gynecology

## 2013-07-16 ENCOUNTER — Encounter (HOSPITAL_COMMUNITY): Payer: Self-pay | Admitting: Anesthesiology

## 2013-07-16 ENCOUNTER — Ambulatory Visit (HOSPITAL_COMMUNITY): Payer: BC Managed Care – PPO | Admitting: Anesthesiology

## 2013-07-16 ENCOUNTER — Encounter (HOSPITAL_COMMUNITY)
Admission: RE | Disposition: A | Discharge status: Home / Self Care | Payer: Self-pay | Source: Ambulatory Visit | Attending: Obstetrics and Gynecology

## 2013-07-16 ENCOUNTER — Encounter (HOSPITAL_COMMUNITY): Payer: Self-pay | Admitting: *Deleted

## 2013-07-16 ENCOUNTER — Inpatient Hospital Stay (HOSPITAL_COMMUNITY)
Admission: RE | Admit: 2013-07-16 | Discharge: 2013-07-18 | DRG: 358 | Disposition: A | Discharge status: Home / Self Care | Payer: BC Managed Care – PPO | Source: Ambulatory Visit | Attending: Obstetrics and Gynecology | Admitting: Obstetrics and Gynecology

## 2013-07-16 DIAGNOSIS — D252 Subserosal leiomyoma of uterus: Secondary | ICD-10-CM

## 2013-07-16 DIAGNOSIS — Z8249 Family history of ischemic heart disease and other diseases of the circulatory system: Secondary | ICD-10-CM

## 2013-07-16 DIAGNOSIS — D250 Submucous leiomyoma of uterus: Secondary | ICD-10-CM

## 2013-07-16 DIAGNOSIS — D251 Intramural leiomyoma of uterus: Secondary | ICD-10-CM

## 2013-07-16 DIAGNOSIS — N838 Other noninflammatory disorders of ovary, fallopian tube and broad ligament: Secondary | ICD-10-CM

## 2013-07-16 DIAGNOSIS — D500 Iron deficiency anemia secondary to blood loss (chronic): Secondary | ICD-10-CM

## 2013-07-16 DIAGNOSIS — Z833 Family history of diabetes mellitus: Secondary | ICD-10-CM

## 2013-07-16 DIAGNOSIS — N920 Excessive and frequent menstruation with regular cycle: Secondary | ICD-10-CM | POA: Diagnosis present

## 2013-07-16 DIAGNOSIS — N72 Inflammatory disease of cervix uteri: Secondary | ICD-10-CM

## 2013-07-16 DIAGNOSIS — Z6841 Body Mass Index (BMI) 40.0 and over, adult: Secondary | ICD-10-CM

## 2013-07-16 DIAGNOSIS — Z803 Family history of malignant neoplasm of breast: Secondary | ICD-10-CM

## 2013-07-16 DIAGNOSIS — I10 Essential (primary) hypertension: Secondary | ICD-10-CM | POA: Diagnosis present

## 2013-07-16 HISTORY — PX: ABDOMINAL HYSTERECTOMY: SHX81

## 2013-07-16 HISTORY — PX: BILATERAL SALPINGECTOMY: SHX5743

## 2013-07-16 LAB — URINALYSIS, ROUTINE W REFLEX MICROSCOPIC
Ketones, ur: NEGATIVE mg/dL
Leukocytes, UA: NEGATIVE
Nitrite: NEGATIVE
Specific Gravity, Urine: 1.02 (ref 1.005–1.030)
Urobilinogen, UA: 0.2 mg/dL (ref 0.0–1.0)
pH: 6 (ref 5.0–8.0)

## 2013-07-16 LAB — CBC
MCH: 25.9 pg — ABNORMAL LOW (ref 26.0–34.0)
Platelets: 359 10*3/uL (ref 150–400)
RBC: 3.74 MIL/uL — ABNORMAL LOW (ref 3.87–5.11)
WBC: 7.6 10*3/uL (ref 4.0–10.5)

## 2013-07-16 SURGERY — HYSTERECTOMY, ABDOMINAL
Anesthesia: General | Site: Abdomen | Wound class: Clean

## 2013-07-16 MED ORDER — ARTIFICIAL TEARS OP OINT
TOPICAL_OINTMENT | OPHTHALMIC | Status: AC
  Filled 2013-07-16: qty 3.5

## 2013-07-16 MED ORDER — MIDAZOLAM HCL 2 MG/2ML IJ SOLN
INTRAMUSCULAR | Status: AC
  Filled 2013-07-16: qty 2

## 2013-07-16 MED ORDER — GLYCOPYRROLATE 0.2 MG/ML IJ SOLN
INTRAMUSCULAR | Status: DC | PRN
  Administered 2013-07-16: 0.6 mg via INTRAVENOUS

## 2013-07-16 MED ORDER — KETOROLAC TROMETHAMINE 30 MG/ML IJ SOLN: 30.0000 mg | Freq: Once | INTRAMUSCULAR | Status: DC

## 2013-07-16 MED ORDER — NEOSTIGMINE METHYLSULFATE 1 MG/ML IJ SOLN
INTRAMUSCULAR | Status: AC
  Filled 2013-07-16: qty 1

## 2013-07-16 MED ORDER — HYDROMORPHONE 0.3 MG/ML IV SOLN
INTRAVENOUS | Status: DC
  Administered 2013-07-16: 1.1 mg via INTRAVENOUS
  Administered 2013-07-16: 13:00:00 via INTRAVENOUS
  Administered 2013-07-17 (×2): 0.3 mg via INTRAVENOUS
  Administered 2013-07-18: 0.9 mg via INTRAVENOUS
  Filled 2013-07-16: qty 25

## 2013-07-16 MED ORDER — PROPOFOL 10 MG/ML IV BOLUS
INTRAVENOUS | Status: DC | PRN
  Administered 2013-07-16: 150 mg via INTRAVENOUS

## 2013-07-16 MED ORDER — ACETAMINOPHEN 325 MG PO TABS: 650.0000 mg | ORAL_TABLET | ORAL | Status: DC | PRN

## 2013-07-16 MED ORDER — ZOLPIDEM TARTRATE 5 MG PO TABS: 5.0000 mg | ORAL_TABLET | Freq: Every evening | ORAL | Status: DC | PRN

## 2013-07-16 MED ORDER — OXYCODONE-ACETAMINOPHEN 5-325 MG PO TABS: 1.0000 | ORAL_TABLET | ORAL | Status: DC | PRN

## 2013-07-16 MED ORDER — ONDANSETRON HCL 4 MG/2ML IJ SOLN
4.0000 mg | Freq: Once | INTRAMUSCULAR | Status: AC
  Administered 2013-07-16: 4 mg via INTRAVENOUS

## 2013-07-16 MED ORDER — HYDROCHLOROTHIAZIDE 25 MG PO TABS
25.0000 mg | ORAL_TABLET | Freq: Every morning | ORAL | Status: DC
  Administered 2013-07-17 – 2013-07-18 (×2): 25 mg via ORAL
  Filled 2013-07-16 (×2): qty 1

## 2013-07-16 MED ORDER — LACTATED RINGERS IV SOLN
INTRAVENOUS | Status: DC
  Administered 2013-07-16: 1000 mL via INTRAVENOUS

## 2013-07-16 MED ORDER — DIPHENHYDRAMINE HCL 50 MG/ML IJ SOLN: 12.5000 mg | Freq: Four times a day (QID) | INTRAMUSCULAR | Status: DC | PRN

## 2013-07-16 MED ORDER — ONDANSETRON HCL 4 MG/2ML IJ SOLN: 4.0000 mg | Freq: Four times a day (QID) | INTRAMUSCULAR | Status: DC | PRN

## 2013-07-16 MED ORDER — SUCCINYLCHOLINE CHLORIDE 20 MG/ML IJ SOLN
INTRAMUSCULAR | Status: AC
  Filled 2013-07-16: qty 1

## 2013-07-16 MED ORDER — 0.9 % SODIUM CHLORIDE (POUR BTL) OPTIME
TOPICAL | Status: DC | PRN
  Administered 2013-07-16: 2000 mL

## 2013-07-16 MED ORDER — FENTANYL CITRATE 0.05 MG/ML IJ SOLN: 25.0000 ug | INTRAMUSCULAR | Status: DC | PRN

## 2013-07-16 MED ORDER — ROCURONIUM BROMIDE 50 MG/5ML IV SOLN
INTRAVENOUS | Status: AC
  Filled 2013-07-16: qty 1

## 2013-07-16 MED ORDER — KETOROLAC TROMETHAMINE 30 MG/ML IJ SOLN
30.0000 mg | Freq: Four times a day (QID) | INTRAMUSCULAR | Status: AC
  Administered 2013-07-16 – 2013-07-17 (×5): 30 mg via INTRAVENOUS
  Filled 2013-07-16 (×5): qty 1

## 2013-07-16 MED ORDER — DEXAMETHASONE SODIUM PHOSPHATE 4 MG/ML IJ SOLN
INTRAMUSCULAR | Status: AC
  Filled 2013-07-16: qty 1

## 2013-07-16 MED ORDER — MIDAZOLAM HCL 2 MG/2ML IJ SOLN
1.0000 mg | INTRAMUSCULAR | Status: DC | PRN
  Administered 2013-07-16 (×2): 2 mg via INTRAVENOUS

## 2013-07-16 MED ORDER — LIDOCAINE HCL (PF) 1 % IJ SOLN
INTRAMUSCULAR | Status: AC
  Filled 2013-07-16: qty 5

## 2013-07-16 MED ORDER — GLYCOPYRROLATE 0.2 MG/ML IJ SOLN
INTRAMUSCULAR | Status: AC
  Filled 2013-07-16: qty 3

## 2013-07-16 MED ORDER — CEFAZOLIN SODIUM-DEXTROSE 2-3 GM-% IV SOLR
2.0000 g | INTRAVENOUS | Status: AC
  Administered 2013-07-16: 2 g via INTRAVENOUS

## 2013-07-16 MED ORDER — SUFENTANIL CITRATE 50 MCG/ML IV SOLN
INTRAVENOUS | Status: DC | PRN
  Administered 2013-07-16 (×5): 10 ug via INTRAVENOUS
  Administered 2013-07-16: 20 ug via INTRAVENOUS

## 2013-07-16 MED ORDER — DOCUSATE SODIUM 100 MG PO CAPS
100.0000 mg | ORAL_CAPSULE | Freq: Two times a day (BID) | ORAL | Status: DC
  Administered 2013-07-16 – 2013-07-18 (×5): 100 mg via ORAL
  Filled 2013-07-16 (×5): qty 1

## 2013-07-16 MED ORDER — PROPOFOL 10 MG/ML IV EMUL
INTRAVENOUS | Status: AC
  Filled 2013-07-16: qty 20

## 2013-07-16 MED ORDER — DIPHENHYDRAMINE HCL 12.5 MG/5ML PO ELIX: 12.5000 mg | ORAL_SOLUTION | Freq: Four times a day (QID) | ORAL | Status: DC | PRN

## 2013-07-16 MED ORDER — ONDANSETRON HCL 4 MG/2ML IJ SOLN
INTRAMUSCULAR | Status: AC
  Filled 2013-07-16: qty 2

## 2013-07-16 MED ORDER — SODIUM CHLORIDE 0.9 % IJ SOLN: 9.0000 mL | INTRAMUSCULAR | Status: DC | PRN

## 2013-07-16 MED ORDER — LISINOPRIL-HYDROCHLOROTHIAZIDE 20-12.5 MG PO TABS: 2.0000 | ORAL_TABLET | Freq: Every morning | ORAL | Status: DC

## 2013-07-16 MED ORDER — LACTATED RINGERS IV SOLN
INTRAVENOUS | Status: DC | PRN
  Administered 2013-07-16 (×4): via INTRAVENOUS

## 2013-07-16 MED ORDER — PANTOPRAZOLE SODIUM 40 MG IV SOLR
40.0000 mg | Freq: Every day | INTRAVENOUS | Status: DC
  Administered 2013-07-16 – 2013-07-17 (×2): 40 mg via INTRAVENOUS
  Filled 2013-07-16 (×2): qty 40

## 2013-07-16 MED ORDER — ATENOLOL 25 MG PO TABS
25.0000 mg | ORAL_TABLET | Freq: Every day | ORAL | Status: DC
  Administered 2013-07-17 – 2013-07-18 (×2): 25 mg via ORAL
  Filled 2013-07-16 (×2): qty 1

## 2013-07-16 MED ORDER — KETOROLAC TROMETHAMINE 30 MG/ML IJ SOLN: 30.0000 mg | Freq: Four times a day (QID) | INTRAMUSCULAR | Status: AC

## 2013-07-16 MED ORDER — SODIUM CHLORIDE 0.9 % IV SOLN
INTRAVENOUS | Status: DC
  Administered 2013-07-16 – 2013-07-17 (×3): via INTRAVENOUS

## 2013-07-16 MED ORDER — SUCCINYLCHOLINE CHLORIDE 20 MG/ML IJ SOLN
INTRAMUSCULAR | Status: DC | PRN
  Administered 2013-07-16: 140 mg via INTRAVENOUS

## 2013-07-16 MED ORDER — LISINOPRIL 10 MG PO TABS
40.0000 mg | ORAL_TABLET | Freq: Every morning | ORAL | Status: DC
  Administered 2013-07-17 – 2013-07-18 (×2): 40 mg via ORAL
  Filled 2013-07-16 (×2): qty 4

## 2013-07-16 MED ORDER — ONDANSETRON HCL 4 MG/2ML IJ SOLN: 4.0000 mg | Freq: Once | INTRAMUSCULAR | Status: DC | PRN

## 2013-07-16 MED ORDER — CEFAZOLIN SODIUM-DEXTROSE 2-3 GM-% IV SOLR
INTRAVENOUS | Status: AC
  Filled 2013-07-16: qty 50

## 2013-07-16 MED ORDER — DEXAMETHASONE SODIUM PHOSPHATE 4 MG/ML IJ SOLN
4.0000 mg | Freq: Once | INTRAMUSCULAR | Status: AC
  Administered 2013-07-16: 4 mg via INTRAVENOUS

## 2013-07-16 MED ORDER — ROCURONIUM BROMIDE 100 MG/10ML IV SOLN
INTRAVENOUS | Status: DC | PRN
  Administered 2013-07-16 (×2): 10 mg via INTRAVENOUS
  Administered 2013-07-16: 40 mg via INTRAVENOUS
  Administered 2013-07-16: 10 mg via INTRAVENOUS

## 2013-07-16 MED ORDER — LIDOCAINE HCL (CARDIAC) 20 MG/ML IV SOLN
INTRAVENOUS | Status: DC | PRN
  Administered 2013-07-16: 50 mg via INTRAVENOUS

## 2013-07-16 MED ORDER — ONDANSETRON HCL 4 MG PO TABS: 4.0000 mg | ORAL_TABLET | Freq: Four times a day (QID) | ORAL | Status: DC | PRN

## 2013-07-16 MED ORDER — SUFENTANIL CITRATE 50 MCG/ML IV SOLN
INTRAVENOUS | Status: AC
  Filled 2013-07-16: qty 1

## 2013-07-16 MED ORDER — NEOSTIGMINE METHYLSULFATE 1 MG/ML IJ SOLN
INTRAMUSCULAR | Status: DC | PRN
  Administered 2013-07-16: 4 mg via INTRAVENOUS

## 2013-07-16 MED ORDER — NALOXONE HCL 0.4 MG/ML IJ SOLN: 0.4000 mg | INTRAMUSCULAR | Status: DC | PRN

## 2013-07-16 SURGICAL SUPPLY — 59 items
APPLIER CLIP 11 MED OPEN (CLIP)
APPLIER CLIP 13 LRG OPEN (CLIP)
BAG HAMPER (MISCELLANEOUS) ×3 IMPLANT
BENZOIN TINCTURE PRP APPL 2/3 (GAUZE/BANDAGES/DRESSINGS) ×3 IMPLANT
BINDER ABD UNIV 9 30-45 (GAUZE/BANDAGES/DRESSINGS) ×2 IMPLANT
BINDER ABDOMINAL 9 (GAUZE/BANDAGES/DRESSINGS) ×3
CELLS DAT CNTRL 66122 CELL SVR (MISCELLANEOUS) IMPLANT
CLIP APPLIE 11 MED OPEN (CLIP) IMPLANT
CLIP APPLIE 13 LRG OPEN (CLIP) IMPLANT
CLOTH BEACON ORANGE TIMEOUT ST (SAFETY) ×3 IMPLANT
COVER LIGHT HANDLE STERIS (MISCELLANEOUS) ×6 IMPLANT
DRAPE WARM FLUID 44X44 (DRAPE) ×3 IMPLANT
DRESSING TELFA 8X3 (GAUZE/BANDAGES/DRESSINGS) ×3 IMPLANT
ELECT BLADE 6 FLAT ULTRCLN (ELECTRODE) ×3 IMPLANT
ELECT REM PT RETURN 9FT ADLT (ELECTROSURGICAL) ×3
ELECTRODE REM PT RTRN 9FT ADLT (ELECTROSURGICAL) ×2 IMPLANT
EVACUATOR DRAINAGE 10X20 100CC (DRAIN) IMPLANT
EVACUATOR SILICONE 100CC (DRAIN)
FORMALIN 10 PREFIL 480ML (MISCELLANEOUS) ×3 IMPLANT
GLOVE ECLIPSE 6.5 STRL STRAW (GLOVE) ×3 IMPLANT
GLOVE ECLIPSE 9.0 STRL (GLOVE) ×3 IMPLANT
GLOVE EXAM NITRILE MD LF STRL (GLOVE) ×3 IMPLANT
GLOVE INDICATOR 7.0 STRL GRN (GLOVE) ×3 IMPLANT
GLOVE INDICATOR STER SZ 9 (GLOVE) ×3 IMPLANT
GLOVE SS BIOGEL STRL SZ 6.5 (GLOVE) ×2 IMPLANT
GLOVE SUPERSENSE BIOGEL SZ 6.5 (GLOVE) ×1
GOWN STRL REIN 3XL LVL4 (GOWN DISPOSABLE) ×3 IMPLANT
GOWN STRL REIN XL XLG (GOWN DISPOSABLE) ×6 IMPLANT
INST SET MAJOR GENERAL (KITS) ×3 IMPLANT
KIT ROOM TURNOVER APOR (KITS) ×3 IMPLANT
MANIFOLD NEPTUNE II (INSTRUMENTS) ×3 IMPLANT
NS IRRIG 1000ML POUR BTL (IV SOLUTION) ×6 IMPLANT
PACK ABDOMINAL MAJOR (CUSTOM PROCEDURE TRAY) ×3 IMPLANT
PAD ABD 5X9 TENDERSORB (GAUZE/BANDAGES/DRESSINGS) ×3 IMPLANT
RETRACTOR WND ALEXIS 25 LRG (MISCELLANEOUS) IMPLANT
RTRCTR WOUND ALEXIS 18CM MED (MISCELLANEOUS)
RTRCTR WOUND ALEXIS 25CM LRG (MISCELLANEOUS)
SET BASIN LINEN APH (SET/KITS/TRAYS/PACK) ×3 IMPLANT
SOL PREP PROV IODINE SCRUB 4OZ (MISCELLANEOUS) ×3 IMPLANT
SPONGE DRAIN TRACH 4X4 STRL 2S (GAUZE/BANDAGES/DRESSINGS) IMPLANT
SPONGE GAUZE 4X4 12PLY (GAUZE/BANDAGES/DRESSINGS) ×3 IMPLANT
SPONGE LAP 18X18 X RAY DECT (DISPOSABLE) IMPLANT
STRIP CLOSURE SKIN 1/2X4 (GAUZE/BANDAGES/DRESSINGS) ×3 IMPLANT
SUT CHROMIC 0 CT 1 (SUTURE) ×51 IMPLANT
SUT CHROMIC 2 0 CT 1 (SUTURE) ×15 IMPLANT
SUT CHROMIC GUT BROWN 0 54 (SUTURE) IMPLANT
SUT CHROMIC GUT BROWN 0 54IN (SUTURE)
SUT ETHILON 3 0 FSL (SUTURE) IMPLANT
SUT PDS AB CT VIOLET #0 27IN (SUTURE) IMPLANT
SUT PLAIN 2 0 XLH (SUTURE) ×3 IMPLANT
SUT PLAIN CT 1/2CIR 2-0 27IN (SUTURE) ×3 IMPLANT
SUT PROLENE 0 CT 1 30 (SUTURE) ×6 IMPLANT
SUT VIC AB 0 CT1 27 (SUTURE)
SUT VIC AB 0 CT1 27XBRD ANTBC (SUTURE) IMPLANT
SUT VIC AB 2-0 CT1 27 (SUTURE)
SUT VIC AB 2-0 CT1 TAPERPNT 27 (SUTURE) IMPLANT
SUT VICRYL 4 0 KS 27 (SUTURE) ×3 IMPLANT
TOWEL BLUE STERILE X RAY DET (MISCELLANEOUS) ×3 IMPLANT
TRAY FOLEY CATH 16FR SILVER (SET/KITS/TRAYS/PACK) ×3 IMPLANT

## 2013-07-16 NOTE — Brief Op Note (Signed)
07/16/2013  10:42 AM  PATIENT:  Anita Mosley  52 y.o. female  PRE-OPERATIVE DIAGNOSIS:  uterine fibroids anemia due to chronic blood loss menometorrhagia  POST-OPERATIVE DIAGNOSIS:  uterine fibroids anemia due to chronic blood loss menometorrhagia  PROCEDURE:  Procedure(s): HYSTERECTOMY ABDOMINAL (N/A) BILATERAL SALPINGECTOMY (Bilateral)  SURGEON:  Surgeon(s) and Role:    * Tilda Burrow, MD - Primary  PHYSICIAN ASSISTANT:   ASSISTANTS: Wrenn RN   ANESTHESIA:   general  EBL:  Total I/O In: 3000 [I.V.:3000] Out: 560 [Urine:160; Blood:400]  BLOOD ADMINISTERED:none  DRAINS: Urinary Catheter (Foley)   LOCAL MEDICATIONS USED:  NONE  SPECIMEN:  Source of Specimen:  Uterus cervix bilateral fallopian tubes  DISPOSITION OF SPECIMEN:  PATHOLOGY  COUNTS:  YES  TOURNIQUET:  * No tourniquets in log *  DICTATION: .Dragon Dictation  PLAN OF CARE: Admit to inpatient   PATIENT DISPOSITION:  PACU - hemodynamically stable.   Delay start of Pharmacological VTE agent (>24hrs) due to surgical blood loss or risk of bleeding: not applicable

## 2013-07-16 NOTE — Transfer of Care (Signed)
Immediate Anesthesia Transfer of Care Note  Patient: Anita Mosley  Procedure(s) Performed: Procedure(s): HYSTERECTOMY ABDOMINAL (N/A) BILATERAL SALPINGECTOMY (Bilateral)  Patient Location: PACU  Anesthesia Type:General  Level of Consciousness: sedated and patient cooperative  Airway & Oxygen Therapy: Patient Spontanous Breathing and Patient connected to face mask oxygen  Post-op Assessment: Report given to PACU RN and Post -op Vital signs reviewed and stable  Post vital signs: Reviewed and stable  Complications: No apparent anesthesia complications

## 2013-07-16 NOTE — Anesthesia Postprocedure Evaluation (Signed)
  Anesthesia Post-op Note  Patient: Anita Mosley  Procedure(s) Performed: Procedure(s): HYSTERECTOMY ABDOMINAL (N/A) BILATERAL SALPINGECTOMY (Bilateral)  Patient Location: PACU  Anesthesia Type:General  Level of Consciousness: sedated and patient cooperative  Airway and Oxygen Therapy: Patient Spontanous Breathing and Patient connected to face mask oxygen  Post-op Pain: mild  Post-op Assessment: Post-op Vital signs reviewed, Patient's Cardiovascular Status Stable, Respiratory Function Stable, Patent Airway, No signs of Nausea or vomiting and Pain level controlled  Post-op Vital Signs: Reviewed and stable  Complications: No apparent anesthesia complications

## 2013-07-16 NOTE — Anesthesia Preprocedure Evaluation (Signed)
Anesthesia Evaluation  Patient identified by MRN, date of birth, ID band Patient awake    Reviewed: Allergy & Precautions, H&P , NPO status , Patient's Chart, lab work & pertinent test results, reviewed documented beta blocker date and time   Airway Mallampati: III TM Distance: >3 FB Neck ROM: Limited  Mouth opening: Limited Mouth Opening  Dental  (+) Teeth Intact and Poor Dentition,    Pulmonary neg pulmonary ROS,  breath sounds clear to auscultation        Cardiovascular hypertension, Pt. on medications and Pt. on home beta blockers Rhythm:Regular Rate:Normal     Neuro/Psych    GI/Hepatic negative GI ROS,   Endo/Other    Renal/GU      Musculoskeletal   Abdominal   Peds  Hematology  (+) Blood dyscrasia, anemia ,   Anesthesia Other Findings   Reproductive/Obstetrics                           Anesthesia Physical Anesthesia Plan  ASA: II  Anesthesia Plan: General   Post-op Pain Management:    Induction: Intravenous  Airway Management Planned: Oral ETT  Additional Equipment:   Intra-op Plan:   Post-operative Plan: Extubation in OR  Informed Consent: I have reviewed the patients History and Physical, chart, labs and discussed the procedure including the risks, benefits and alternatives for the proposed anesthesia with the patient or authorized representative who has indicated his/her understanding and acceptance.     Plan Discussed with:   Anesthesia Plan Comments:         Anesthesia Quick Evaluation

## 2013-07-16 NOTE — Interval H&P Note (Signed)
History and Physical Interval Note:  07/16/2013 7:27 AM  Anita Mosley  has presented today for surgery, with the diagnosis of uterine fibroids anemia due to chronic blood loss menometorrhagia  The various methods of treatment have been discussed with the patient and family. After consideration of risks, benefits and other options for treatment, the patient has consented to  Procedure(s): HYSTERECTOMY ABDOMINAL (N/A) BILATERAL SALPINGECTOMY (Bilateral) as a surgical intervention .  The patient's history has been reviewed, patient examined, no change in status, stable for surgery.  I have reviewed the patient's chart and labs.  Questions were answered to the patient's satisfaction.     Tilda Burrow

## 2013-07-16 NOTE — Progress Notes (Signed)
Sleeping quietly. Opens eyes slowly to name. C/O postop abd pain. Rates pain 2. Returns to sleep.

## 2013-07-16 NOTE — Preoperative (Signed)
Beta Blockers   Reason not to administer Beta Blockers:Not Applicable 

## 2013-07-16 NOTE — Anesthesia Procedure Notes (Signed)
Procedure Name: Intubation Date/Time: 07/16/2013 7:50 AM Performed by: Carolyne Littles, Eldor Conaway L Pre-anesthesia Checklist: Patient identified, Patient being monitored, Timeout performed, Emergency Drugs available and Suction available Patient Re-evaluated:Patient Re-evaluated prior to inductionOxygen Delivery Method: Circle System Utilized Preoxygenation: Pre-oxygenation with 100% oxygen Intubation Type: IV induction Ventilation: Mask ventilation without difficulty Grade View: Grade I Tube type: Oral Tube size: 7.0 mm Number of attempts: 1 Airway Equipment and Method: stylet and Video-laryngoscopy Placement Confirmation: ETT inserted through vocal cords under direct vision,  positive ETCO2 and breath sounds checked- equal and bilateral Secured at: 21 cm Tube secured with: Tape Dental Injury: Teeth and Oropharynx as per pre-operative assessment

## 2013-07-16 NOTE — H&P (View-Only) (Signed)
Anita Mosley is an 52 y.o. female. Anita Mosley is scheduled for abdominal hysterectomy and bilateral salpingectomy with preservation of the ovaries on 07/16/2013. The patient has experienced severe anemia due to heavy menses which is attributed to the large submucosal fibroid which is deforming the endometrial cavity and making lesser procedures such as endometrial ablation unlikely to be effective Bleeding has been controlled with the use of Megace, endometrial biopsy has been performed, showing benign endometrium. Pap smear is normal, 05/10/2013. The pros and cons of ovarian preservation have been discussed. The uterus is enlarged with 14.2 x 8.9 x 8.6 cm uterine size with multiple fibroids including a 5 cm central fibroid submucosal in location. Ovaries are normal in appearance bilateral Surgery has been delayed well patient recovered her anemia, and recovered from her recent parotid surgery on the left at [facility].  Pertinent Gynecological History: Menses: flow is excessive with use of Both pads or tampons on heaviest days Bleeding: Excessive heavy menstrual flow Contraception: abstinence DES exposure: unknown Blood transfusions: Transfused prior to parotid surgery as well as after the surgery at [facility] Sexually transmitted diseases: no past history and negative GC Chlamydia culture in May Previous GYN Procedures: Benign endometrial biopsy in 05/10/2013  Last mammogram:  Date:  Last pap: normal Date: May 2014 OB History: G, P   Menstrual History: Menarche age:  Patient's last menstrual period was 05/10/2013.    Past Medical History  Diagnosis Date  . Fibroids     No past surgical history on file.  Family History  Problem Relation Age of Onset  . Cancer Mother     breast  . Diabetes Father   . Hypertension Father     Social History:  reports that she has never smoked. She does not have any smokeless tobacco history on file. She reports that she does not drink alcohol or  use illicit drugs.  Allergies:  Allergies  Allergen Reactions  . Aspirin Diarrhea and Other (See Comments)    Fever, upset stomach  . Mushroom Extract Complex Diarrhea and Other (See Comments)    Fever, upset stomach     (Not in a hospital admission)  ROS  Last menstrual period 05/10/2013. Physical Exam  Physical Examination: General appearance - alert, well appearing, and in no distress, oriented to person, place, and time and anxious Mental status - alert, oriented to person, place, and time, anxious, acknowledges understanding of planned surgery and need for hysterectomy Eyes - pupils equal and reactive, extraocular eye movements intact Neck - supple, no significant adenopathy, well-healed left side of face and neck status post recent parotid surgery at [facility] Chest - clear to auscultation, no wheezes, rales or rhonchi, symmetric air entry Heart - normal rate and regular rhythm Abdomen - soft, nontender, nondistended, no masses or organomegaly Obese Pelvic - VULVA: normal appearing vulva with no masses, tenderness or lesions, VAGINA: normal appearing vagina with normal color and discharge, no lesions, CERVIX: normal appearing cervix without discharge or lesions, UTERUS: anteverted, enlarged to 12-14 weeks size, irregular, ADNEXA: normal adnexa in size, nontender and no masses, fibroid uterus limits exam but ultrasound has been performed and shows normal ovaries, PAP: Normal in May Extremities - peripheral pulses normal, no pedal edema, no clubbing or cyanosis  No results found for this or any previous visit (from the past 24 hour(s)).  No results found.  Assessment/Plan: 1 symptomatic uterine fibroids, submucosal in location with subsequent menometrorrhagia and anemia 2. Hypertension 3. Obesity 4. Status post benign   parotid tumor removal  Plan: Total abdominal hysterectomy with removal of cervix and removal of both tubes, with planned ovarian  preservation.  Gaje Tennyson V 07/10/2013, 7:31 AM  

## 2013-07-16 NOTE — Op Note (Signed)
PRE-OPERATIVE DIAGNOSIS:  uterine fibroids anemia due to chronic blood loss menometorrhagia  POST-OPERATIVE DIAGNOSIS:  uterine fibroids 14-16 week size, 450 g estimate weight anemia due to chronic blood loss menometorrhagia  PROCEDURE:  Procedure(s): HYSTERECTOMY ABDOMINAL (N/A) BILATERAL SALPINGECTOMY (Bilateral)   SURGEON:  Surgeon(s) and Role:    * Tilda Burrow, MD - Primary Details of procedure: Patient was taken to the operating room prepped and draped for abdominal procedure with Foley catheter in place vaginal prepping performed timeout conducted and confirmed by operative team. Ancef administered 2 g intravenous. Midline vertical incision from umbilicus to the panniculus crease was then performed removing a 15 cm long by 8 cm x 8 cm deep ellipse of skin and underlying fat for improved access. The fascia was opened in the midline and the excess muscle split. The pyramidalis muscles remained intact on the left side. Peritoneal cavity was carefully entered bluntly, no adhesions encountered. The bowel was packed away Balfour retractor in place and bladder flap in position. The uterus had a fundal exophytic fibroid that could be used for manipulation of the uterine body. Round ligaments could be identified on each side, clamped cut and suture ligated with 0 chromic suture used to the utero-ovarian ligaments could be isolated on either side and the fallopian tube, utero-ovarian ligament complex was then clamped cut and suture ligated with 0 chromic.. Bladder flap was developed anteriorly and bladder pushed inferior. Uterine vessels were easily visualized on the left side, doubly clamped with curved Heaney clamps Kelly clamp for used for backbleeding, then the vessels transected and doubly ligated with 0 chromic. Bleeding was controlled with suture as well. The right side was treated as the same way. On the right side the cardinal ligaments were clamped with straight Heaney clamp, and then  the malleable placed behind the uterus for protection and the uterus amputated off of the lower uterine segment for improved visibility. Coker clamp was used to grasp the lower uterine segment stop. The bladder was pushed further down, and the upper and lower cardinal ligaments clamped cut and suture ligated in serial small bites using Zeppelin clamps knife dissection and 0 chromic suture ligature. Upon reaching the point that the anterior cervical fornix was thought to be identifiable but her placing a right angle clamp through the cervix and identifying the anterior fornix, and a stab incision was made into the fornix using a knife, and then efforts made to amputate the cervix off the vaginal cuff. We had to moved the cut globe in inferior as a thin rim of the cervix was being (anteriorly. The slight increased bleeding from the anterior cuff during this portion of the procedure. The cervix was removed off the vaginal cuff. The right side was quite vascular and required multiple Coker clamps to grasp the edges which were slightly irregular. Aldridge stitch was placed at each lateral vaginal angle, then a series of interrupted 0 Vicryl sutures used to oversew the cuff. Particular care was taken in the midline to make sure that the anterior cuff was separate from the bladder, before completing sutures in this area. The Foley bulb could be felt low in the bladder, quite some distance from the vaginal cuff being operated on during this portion of procedure, in its normal position inside the bladder..   Urine output remained clear. After cuff was closed, pedicles were inspected and hemostasis considered adequate except on the left lower cardinal ligament which required a an additional figure-of-eight suture placed very superficially complete hemostasis. Some point  cautery was necessary of the cuff Bilateral salpingectomy followed, identified first the right tube along its entirety, elevating it placing Kelley clamp  beneath it and dictating off the tube with 2-0 chromic used to tie the pedicle. On the patient's left side the pedicle was taken down similarly but to see clamps used, one from each end and each one was tied separately. Hemostasis. Irrigation the pelvis was performed. Cuff was inspected and there was a small point cautery necessary then hemostasis deemed adequate. Pelvis was irrigated, and laparotomy equipment removed, the anterior peritoneum closed using running 2-0 chromic, the fascia closed using continuous running 0 Prolene beginning at each and sewing to the middle of the incision, then the subcutaneous fatty tissues loosely reapproximated with interrupted 20 plain sutures, 2 layers until about 5 sutures, followed by subcuticular 4-0 Vicryl closure skin edge. Sponge and needle counts were correct throughout EBL 350 cc

## 2013-07-17 LAB — BASIC METABOLIC PANEL
Calcium: 8.6 mg/dL (ref 8.4–10.5)
GFR calc Af Amer: 59 mL/min — ABNORMAL LOW (ref >90)
GFR calc non Af Amer: 51 mL/min — ABNORMAL LOW (ref >90)
Sodium: 137 mEq/L (ref 135–145)

## 2013-07-17 LAB — CBC
MCH: 25.8 pg — ABNORMAL LOW (ref 26.0–34.0)
MCHC: 31.6 g/dL (ref 30.0–36.0)
Platelets: 362 10*3/uL (ref 150–400)

## 2013-07-17 NOTE — Progress Notes (Addendum)
Pt ambulated this morning in room. Pt was able to walk to door and back. She experienced no SOB or dyspnea. Pt did experience pain and is agreeable to attempt walking in hallway later this afternoon. Will continue to monitor.   1300: Pt walked in hallway today. Pt walked from room to nurses desk. She complained of some pain, but she had no SOB or dyspnea. Will continue to monitor.

## 2013-07-17 NOTE — Anesthesia Postprocedure Evaluation (Signed)
Anesthesia Post Note  Patient: Anita Mosley  Procedure(s) Performed: Procedure(s) (LRB): HYSTERECTOMY ABDOMINAL (N/A) BILATERAL SALPINGECTOMY (Bilateral)  Anesthesia type: General  Patient location: 336  Post pain: Pain level controlled  Post assessment: Post-op Vital signs reviewed, Patient's Cardiovascular Status Stable, Respiratory Function Stable, Patent Airway, No signs of Nausea or vomiting and Pain level controlled  Last Vitals:  Filed Vitals:   07/17/13 1037  BP: 116/71  Pulse: 73  Temp: 36.9 C  Resp: 20    Post vital signs: Reviewed and stable  Level of consciousness: awake and alert   Complications: No apparent anesthesia complications

## 2013-07-17 NOTE — Progress Notes (Signed)
Pt had foley removed at 0700 and has started voiding on her own. Will continue to monitor.

## 2013-07-17 NOTE — Progress Notes (Signed)
UR Chart Review Completed  

## 2013-07-17 NOTE — Progress Notes (Signed)
1 Day Post-Op Procedure(s) (LRB): HYSTERECTOMY ABDOMINAL (N/A) BILATERAL SALPINGECTOMY (Bilateral)  Subjective: Patient reports incisional pain, tolerating PO and no problems voiding.  Foley out  Objective: I have reviewed patient's vital signs, medications and labs. Temp:  [97.9 F (36.6 C)-98.8 F (37.1 C)] 98.5 F (36.9 C) (07/30 0658) Pulse Rate:  [65-94] 85 (07/30 0658) Resp:  [13-31] 29 (07/30 0658) BP: (134-178)/(50-77) 148/73 mmHg (07/30 0658) SpO2:  [99 %-100 %] 100 % (07/30 0658) Weight:  [219 lb (99.338 kg)] 219 lb (99.338 kg) (07/29 1713) General: alert, cooperative and morbidly obese Resp: clear to auscultation bilaterally GI: soft, non-tender; bowel sounds normal; no masses,  no organomegaly and incision: clean, dry and dresssing dry and Abd Binder removed for exam and reattached CBC    Component Value Date/Time   WBC 16.5* 07/17/2013 0526   RBC 3.68* 07/17/2013 0526   HGB 9.5* 07/17/2013 0526   HGB 9.9* 06/24/2013 1524   HCT 30.1* 07/17/2013 0526   PLT 362 07/17/2013 0526   MCV 81.8 07/17/2013 0526   MCH 25.8* 07/17/2013 0526   MCHC 31.6 07/17/2013 0526   RDW 14.7 07/17/2013 0526   LYMPHSABS 1.7 03/25/2013 1406   MONOABS 0.8 03/25/2013 1406   EOSABS 0.2 03/25/2013 1406   BASOSABS 0.1 03/25/2013 1406     Assessment: s/p Procedure(s): HYSTERECTOMY ABDOMINAL (N/A) BILATERAL SALPINGECTOMY (Bilateral): stable and tolerating diet  Plan: Advance diet Encourage ambulation Discontinue IV fluids  LOS: 1 day    Anita Mosley V 07/17/2013, 8:40 AM

## 2013-07-18 ENCOUNTER — Encounter (HOSPITAL_COMMUNITY): Payer: Self-pay | Admitting: Obstetrics and Gynecology

## 2013-07-18 MED ORDER — PANTOPRAZOLE SODIUM 40 MG PO TBEC
40.0000 mg | DELAYED_RELEASE_TABLET | Freq: Every day | ORAL | Status: DC
  Administered 2013-07-18: 40 mg via ORAL
  Filled 2013-07-18: qty 1

## 2013-07-18 MED ORDER — OXYCODONE-ACETAMINOPHEN 5-325 MG PO TABS: 1.0000 | ORAL_TABLET | ORAL | Status: DC | PRN

## 2013-07-18 NOTE — Progress Notes (Signed)
The patient is receiving Protonix by the intravenous route.  Based on criteria approved by the Pharmacy and Therapeutics Committee and the Medical Executive Committee, the medication is being converted to the equivalent oral dose form.  These criteria include: -No Active GI bleeding -Able to tolerate diet of full liquids (or better) or tube feeding OR able to tolerate other medications by the oral or enteral route  If you have any questions about this conversion, please contact the Pharmacy Department (ext [phone]).  Thank you.  Elson Clan, [place] 07/18/2013 8:46 AM

## 2013-07-18 NOTE — Progress Notes (Signed)
Pt has been refusing to walk in the hall despite RN encouragement. Pt has been sitting on edge of bed and ambulating to the bathroom throughout the morning. Will continue to encourage pt to ambulate.

## 2013-07-18 NOTE — Discharge Summary (Signed)
Physician Discharge Summary  Patient ID: Anita Mosley MRN: [id number] DOB/AGE: [birth year] 50 y.o.  Admit date: 07/16/2013 Discharge date: 07/18/2013  Admission Diagnoses: uterine fibroids 14 week size, anemia secondary to irregular bleeding  Discharge Diagnoses: uterine fibroids 14 week size, anemia secondary to irregular bleeding   Active Problems:   * No active hospital problems. *   Discharged Condition: good  Hospital Course: Patient was admitted and underwent abdominal hysterectomy through midline vertical incision, with a 2 day postop care that was uneventful, with preoperative 11.5, hematocrit 35.5 with discharge hemoglobin 9.5 hematocrit 30%  Consults: None  Significant Diagnostic Studies: labs:  CBC    Component Value Date/Time   WBC 16.5* 07/17/2013 0526   RBC 3.68* 07/17/2013 0526   HGB 9.5* 07/17/2013 0526   HGB 9.9* 06/24/2013 1524   HCT 30.1* 07/17/2013 0526   PLT 362 07/17/2013 0526   MCV 81.8 07/17/2013 0526   MCH 25.8* 07/17/2013 0526   MCHC 31.6 07/17/2013 0526   RDW 14.7 07/17/2013 0526   LYMPHSABS 1.7 03/25/2013 1406   MONOABS 0.8 03/25/2013 1406   EOSABS 0.2 03/25/2013 1406   BASOSABS 0.1 03/25/2013 1406    and   Treatments: surgery: Abdominal hysterectomy salpingectomy  Discharge Exam: Blood pressure 146/75, pulse 88, temperature 99.1 F (37.3 C), temperature source Oral, resp. rate 20, height 5\' 1"  (1.549 m), weight 99.338 kg (219 lb), last menstrual period 05/02/2013, SpO2 100.00%. General appearance: alert, cooperative, no distress and Slow ambulate for, prefers to rest in bed GI: soft, non-tender; bowel sounds normal; no masses,  no organomegaly and Incision clean dry dressing dry Extremities: Homans sign is negative, no sign of DVT  Disposition: 01-Home or Self Care  Discharge Orders   Future Appointments Provider Department Dept Phone   07/31/2013 9:15 AM Tilda Burrow, MD [facility] [phone]   Future Orders Complete By Expires     Call  MD for:  difficulty breathing, headache or visual disturbances  As directed     Call MD for:  persistant nausea and vomiting  As directed     Call MD for:  redness, tenderness, or signs of infection (pain, swelling, redness, odor or green/yellow discharge around incision site)  As directed     Call MD for:  temperature >100.4  As directed     Diet - low sodium heart healthy  As directed     Discharge instructions  As directed     Comments:      General Gynecological Post-Operative Instructions You may expect to feel dizzy, weak, and drowsy for as long as 24 hours after receiving the medicine that made you sleep (anesthetic). The following information pertains to your recovery period for the first 24 hours following surgery.  Do not drive a car, ride a bicycle, participate in physical activities, or take public transportation until you are done taking narcotic pain medicines or as directed by your caregiver.  Do not drink alcohol or take tranquilizers.  Do not take medicine that has not been prescribed by your caregiver.  Do not sign important papers or make important decisions while on narcotic pain medicines.  Have a responsible person with you.  CARE OF INCISION  Keep incision clean and dry. Take showers instead of baths until your caregiver gives you permission to take baths, usually 48 hours after going home.  Avoid heavy lifting (more than 10 pounds/4.5 kilograms), pushing, or pulling.  Avoid activities that may risk injury to your surgical site.  Only  take over-the-counter or prescription medicines for pain, discomfort, or fever as directed by your caregiver. Do not take aspirin. It can make you bleed. Take medicines (antibiotics) that kill germs as directed.  Call the office or go to the MAU if:  You feel sick to your stomach (nauseous).  You start to throw up (vomit).  You have trouble eating or drinking.  You have an oral temperature above 100.4.  You have constipation that is not  helped by adjusting diet or increasing fluid intake. Pain medicines are a common cause of constipation.  SEEK IMMEDIATE MEDICAL CARE IF:  You have persistent dizziness.  You have difficulty breathing or a congested sounding (croupy) cough.  You have an oral temperature above 102.5, not controlled by medicine.  There is increasing pain or tenderness near or in the surgical site.  ExitCare Patient Information 2011 [place], [place].    Driving Restrictions  As directed     Comments:      No driving x2 weeks    Increase activity slowly  As directed     Scheduling Instructions:      Walk several times daily through the house    Remove dressing in 24 hours  As directed         Medication List    STOP taking these medications       megestrol 40 MG tablet  Commonly known as:  MEGACE      TAKE these medications       atenolol 25 MG tablet  Commonly known as:  TENORMIN  Take 25 mg by mouth daily.     ferrous sulfate 325 (65 FE) MG tablet  Take 1 tablet (325 mg total) by mouth 2 (two) times daily.     lisinopril-hydrochlorothiazide 20-12.5 MG per tablet  Commonly known as:  PRINZIDE,ZESTORETIC  Take 2 tablets by mouth every morning.     ONE-A-DAY WOMENS PO  Take 1 tablet by mouth daily as needed (as supplement).     oxyCODONE-acetaminophen 5-325 MG per tablet  Commonly known as:  PERCOCET/ROXICET  Take 1-2 tablets by mouth every 4 (four) hours as needed.           Follow-up Information   Follow up with Tilda Burrow, MD. (Appointment 2 weeks as described , or earlier as needed)    Contact information:   [address] [place] [address] [phone]       Signed: Tilda Burrow 07/18/2013, 2:22 PM

## 2013-07-18 NOTE — Anesthesia Postprocedure Evaluation (Signed)
  Anesthesia Post-op Note  Patient: Anita Mosley  Procedure(s) Performed: Procedure(s): HYSTERECTOMY ABDOMINAL (N/A) BILATERAL SALPINGECTOMY (Bilateral)  Patient Location: 336  Anesthesia Type:General  Level of Consciousness: sedated and patient cooperative  Airway and Oxygen Therapy: Patient Spontanous Breathing and Patient connected to nasal cannula oxygen  Post-op Pain: moderate  Post-op Assessment: Post-op Vital signs reviewed, Patient's Cardiovascular Status Stable, Respiratory Function Stable, Patent Airway and No signs of Nausea or vomiting  Post-op Vital Signs: Reviewed and stable  Complications: No apparent anesthesia complications

## 2013-07-18 NOTE — Progress Notes (Signed)
Pt is to be discharged home today. Pt is in NAD, IV is out, all paperwork has been reviewed/discussed with patient, and there are no questions/concerns at this time. Assessment is unchanged from this morning. Pt is to be accompanied downstairs by staff and family via wheelchair.  

## 2013-07-20 LAB — TYPE AND SCREEN
ABO/RH(D): A NEG
Antibody Screen: NEGATIVE

## 2013-07-25 ENCOUNTER — Telehealth: Payer: Self-pay | Admitting: Obstetrics & Gynecology

## 2013-07-25 NOTE — Telephone Encounter (Signed)
Pt c/o constipation. Pt had abdominal hysterectomy on 07/16/13. Per Cyril Mourning, NP, glycerin suppository to help with constipation.

## 2013-07-31 ENCOUNTER — Ambulatory Visit (INDEPENDENT_AMBULATORY_CARE_PROVIDER_SITE_OTHER): Payer: BC Managed Care – PPO | Admitting: Obstetrics and Gynecology

## 2013-07-31 ENCOUNTER — Encounter: Payer: Self-pay | Admitting: Obstetrics and Gynecology

## 2013-07-31 VITALS — BP 162/88 | Ht 62.0 in | Wt 199.6 lb

## 2013-07-31 DIAGNOSIS — Z9889 Other specified postprocedural states: Secondary | ICD-10-CM

## 2013-07-31 NOTE — Progress Notes (Signed)
Patient ID: Anita Mosley, female   DOB: [birth year], 52 y.o.   MRN: [id number] Pt here today for post op visit. Pt denies any problems or concerns at this time.  Assessment:  Post-Op status post total abdominal hysterectomy for fibroids.2 weeks ago.  Doing well postoperatively. Physical Examination: General appearance - alert, well appearing, and in no distress, overweight and anxious Abdomen - soft, nontender, nondistended, no masses or organomegaly scars from previous incisions well approximated    Plan:  1. Continue any current medications. 2. Wound care discussed. 3. Activity restrictions: no driving 4. Anticipated return to work: 4 weeks. 5. Follow up in 4 week.

## 2013-07-31 NOTE — Patient Instructions (Signed)
Be active , gradually increasing your activity.

## 2013-08-14 ENCOUNTER — Telehealth: Payer: Self-pay | Admitting: Obstetrics and Gynecology

## 2013-08-14 NOTE — Telephone Encounter (Signed)
Spoke with Anita Batten. Pt is having trouble sleeping, feels nervous, gagging over 1 week. Had a hyst on 07/16/13. I advised I don't feel it's related to surgery or anything gyn. Advised to call primary dr, Dr. Merilynn Finland. [name] voiced understanding. JSY

## 2013-09-02 ENCOUNTER — Ambulatory Visit (INDEPENDENT_AMBULATORY_CARE_PROVIDER_SITE_OTHER): Payer: BC Managed Care – PPO | Admitting: Obstetrics and Gynecology

## 2013-09-02 ENCOUNTER — Encounter: Payer: Self-pay | Admitting: Obstetrics and Gynecology

## 2013-09-02 VITALS — BP 158/80 | Ht 60.0 in | Wt 200.6 lb

## 2013-09-02 DIAGNOSIS — Z9889 Other specified postprocedural states: Secondary | ICD-10-CM

## 2013-09-02 DIAGNOSIS — D649 Anemia, unspecified: Secondary | ICD-10-CM

## 2013-09-02 DIAGNOSIS — Z09 Encounter for follow-up examination after completed treatment for conditions other than malignant neoplasm: Secondary | ICD-10-CM

## 2013-09-02 LAB — POCT HEMOGLOBIN: Hemoglobin: 12.6 g/dL (ref 12.2–16.2)

## 2013-09-02 NOTE — Addendum Note (Signed)
Addended by: Criss Alvine on: 09/02/2013 11:23 AM   Modules accepted: Orders

## 2013-09-02 NOTE — Progress Notes (Signed)
Patient ID: Anita Mosley, female   DOB: 1961-11-05, 52 y.o.   MRN: [id number] Pt here today for post op for abdominal hysterectomy 07/16/2013. Subjective:     Anita Mosley is a 52 y.o. female who presents to the clinic 6 weeks status post total abdominal hysterectomy for fibroids. Diet:       regular without difficulty. Bowel function is: normal. Pain:     The patient is not having any pain.  The following portions of the patient's history were reviewed and updated as appropriate: allergies, current medications, past family history, past medical history, past social history, past surgical history and problem list. On Iron pills.from Dr Merilynn Finland at [facility]. For anemia.Marland KitchenPt [facility] cares [facility]  Review of Systems Pertinent items are noted in HPI.    Objective:    BP 158/80  Ht 5' (1.524 m)  Wt 200 lb 9.6 oz (90.992 kg)  BMI 39.18 kg/m2  LMP 04/11/2013 General:  alert, cooperative and no distress  Abdomen: soft, bowel sounds active, non-tender, well healed scar  Incision:   healing well, no erythema, no swelling, no dehiscence, incision well approximated       Pelvic: normal cuff, slight thickening, normal tenderness   hgb 12.6 Assessment:    Doing well postoperatively. Operative findings again reviewed. Pathology report discussed.    Plan:    1. Continue any current medications. 2. Wound care discussed. 3. Activity restrictions: return to work next monday, 9/22 full duty 4. Anticipated return to work: 1-2 weeks and work Physicist, medical provided. 5. Follow up: prn or i yr  for  .

## 2013-09-02 NOTE — Patient Instructions (Signed)
Given note for work begin 9/22

## 2013-10-16 ENCOUNTER — Telehealth: Payer: Self-pay | Admitting: Obstetrics and Gynecology

## 2013-10-16 NOTE — Telephone Encounter (Signed)
Joaquin Courts, pt niece, states pt having c/o nervousness, gagging, hot flashes, unable to sleep. Thinks it could be hormone related. Call transferred to front staff for an appt to be made.

## 2013-10-24 ENCOUNTER — Other Ambulatory Visit: Payer: Self-pay

## 2013-10-25 ENCOUNTER — Encounter: Payer: Self-pay | Admitting: Obstetrics and Gynecology

## 2013-10-25 ENCOUNTER — Ambulatory Visit (INDEPENDENT_AMBULATORY_CARE_PROVIDER_SITE_OTHER): Payer: BC Managed Care – PPO | Admitting: Obstetrics and Gynecology

## 2013-10-25 VITALS — BP 120/72 | Ht 62.0 in | Wt 194.0 lb

## 2013-10-25 DIAGNOSIS — N951 Menopausal and female climacteric states: Secondary | ICD-10-CM

## 2013-10-25 NOTE — Patient Instructions (Signed)
Read about menopause  Menopause Menopause is the normal time of life when menstrual periods stop completely. Menopause is complete when you have missed 12 consecutive menstrual periods. It usually occurs between the ages of 48 years and 55 years. Very rarely does a woman develop menopause before the age of 40 years. At menopause, your ovaries stop producing the female hormones estrogen and progesterone. This can cause undesirable symptoms and also affect your health. Sometimes the symptoms may occur 4 5 years before the menopause begins. There is no relationship between menopause and:  Oral contraceptives.  Number of children you had.  Race.  The age your menstrual periods started (menarche). Heavy smokers and very thin women may develop menopause earlier in life. CAUSES  The ovaries stop producing the female hormones estrogen and progesterone.  Other causes include:  Surgery to remove both ovaries.  The ovaries stop functioning for no known reason.  Tumors of the pituitary gland in the brain.  Medical disease that affects the ovaries and hormone production.  Radiation treatment to the abdomen or pelvis.  Chemotherapy that affects the ovaries. SYMPTOMS   Hot flashes.  Night sweats.  Decrease in sex drive.  Vaginal dryness and thinning of the vagina causing painful intercourse.  Dryness of the skin and developing wrinkles.  Headaches.  Tiredness.  Irritability.  Memory problems.  Weight gain.  Bladder infections.  Hair growth of the face and chest.  Infertility. More serious symptoms include:  Loss of bone (osteoporosis) causing breaks (fractures).  Depression.  Hardening and narrowing of the arteries (atherosclerosis) causing heart attacks and strokes. DIAGNOSIS   When the menstrual periods have stopped for 12 straight months.  Physical exam.  Hormone studies of the blood. TREATMENT  There are many treatment choices and nearly as many questions  about them. The decisions to treat or not to treat menopausal changes is an individual choice made with your health care provider. Your health care provider can discuss the treatments with you. Together, you can decide which treatment will work best for you. Your treatment choices may include:   Hormone therapy (estrogen and progesterone).  Non-hormonal medicines.  Treating the individual symptoms with medicine (for example antidepressants for depression).  Herbal medicines that may help specific symptoms.  Counseling by a psychiatrist or psychologist.  Group therapy.  Lifestyle changes including:  Eating healthy.  Regular exercise.  Limiting caffeine and alcohol.  Stress management and meditation.  No treatment. HOME CARE INSTRUCTIONS   Take the medicine your health care provider gives you as directed.  Get plenty of sleep and rest.  Exercise regularly.  Eat a diet that contains calcium (good for the bones) and soy products (acts like estrogen hormone).  Avoid alcoholic beverages.  Do not smoke.  If you have hot flashes, dress in layers.  Take supplements, calcium, and vitamin D to strengthen bones.  You can use over-the-counter lubricants or moisturizers for vaginal dryness.  Group therapy is sometimes very helpful.  Acupuncture may be helpful in some cases. SEEK MEDICAL CARE IF:   You are not sure you are in menopause.  You are having menopausal symptoms and need advice and treatment.  You are still having menstrual periods after age 67 years.  You have pain with intercourse.  Menopause is complete (no menstrual period for 12 months) and you develop vaginal bleeding.  You need a referral to a specialist (gynecologist, psychiatrist, or psychologist) for treatment. SEEK IMMEDIATE MEDICAL CARE IF:   You have severe depression.  You  have excessive vaginal bleeding.  You fell and think you have a broken bone.  You have pain when you urinate.  You  develop leg or chest pain.  You have a fast pounding heart beat (palpitations).  You have severe headaches.  You develop vision problems.  You feel a lump in your breast.  You have abdominal pain or severe indigestion. Document Released: 02/25/2004 Document Revised: 08/07/2013 Document Reviewed: 07/04/2013 [facility] Patient Information 2014 [place], [place].   [employer] IS A GOOD WEBSITE

## 2013-10-25 NOTE — Progress Notes (Signed)
Patient ID: Anita Mosley, female   DOB: [birth year], 52 y.o.   MRN: [id number] Pt states she is not sleeping, pacing back and forth, hot all the time. Could this have anything to do with her hormones?    [facility] Visit  Patient name: Anita Mosley MRN [id number]  Date of birth: 07-13-1961  CC & HPI:  Anita Mosley is a 52 y.o. female presenting today for f/u after admission. Pt has been inability to sleep at night, inability to stay focused on tasks. She has a h/o hysterectomy on July 9th, 2014 [facility], salpingectomy, with ovaries PRESERVED  Pt stays with sister Anita Mosley in [facility] is PCP ROS:  Weight decreasing.  Pertinent History Reviewed:  Medical & Surgical Hx:  Reviewed: Significant for hyst salpingectomy Medications: Reviewed & Updated - see associated section Social History: Reviewed -  reports that she has never smoked. She has never used smokeless tobacco.  Objective Findings:  Vitals: BP 120/72  Ht 5\' 2"  (1.575 m)  Wt 194 lb (87.998 kg)  BMI 35.47 kg/m2  LMP 04/11/2013  Physical Examination: General appearance - alert, well appearing, and in no distress, overweight and oriented x 3 Physical Examination: Mental status - alert, oriented to person, place, and time, normal mood, behavior, speech, dress, motor activity, and thought processes Eyes - pupils equal and reactive, extraocular eye movements intact Chest - unlabored breathing ,   Assessment & Plan:   Probable ovarian failure/menopause Check Fsh, Tsh

## 2013-10-26 LAB — FOLLICLE STIMULATING HORMONE: FSH: 4.8 m[IU]/mL

## 2013-11-01 ENCOUNTER — Encounter: Payer: Self-pay | Admitting: Obstetrics and Gynecology

## 2013-11-01 ENCOUNTER — Ambulatory Visit (INDEPENDENT_AMBULATORY_CARE_PROVIDER_SITE_OTHER): Payer: BC Managed Care – PPO | Admitting: Obstetrics and Gynecology

## 2013-11-01 VITALS — BP 122/84 | Ht 62.0 in | Wt 194.0 lb

## 2013-11-01 DIAGNOSIS — N951 Menopausal and female climacteric states: Secondary | ICD-10-CM

## 2013-11-01 NOTE — Patient Instructions (Signed)
Please discuss these sensations of feeling hot your Dr. Merilynn Finland

## 2013-11-01 NOTE — Progress Notes (Signed)
Followup regarding Anita Mosley is evidence of being hot inside for a couple of hours after taking her blood pressure medicine and her iron tablets (Zestoretic 20-12.5, Tenormin 25 mg, also taking iron tablets and multivitamins and calcium.) Labs showed FSH value is premenopausal she indicating ovarian function process and thyroid function test normal Impression no evidence of vasomotor symptoms are the tissue. Patient may be experiencing some peripheral vasodilation from her blood pressure medicines. Patient advised to discuss with her blood pressure physician at next appointment Impression normal ovarian function status post hysterectomy

## 2018-08-24 ENCOUNTER — Other Ambulatory Visit (HOSPITAL_COMMUNITY): Payer: Self-pay | Admitting: Respiratory Therapy

## 2018-08-24 DIAGNOSIS — R0602 Shortness of breath: Secondary | ICD-10-CM

## 2018-08-31 ENCOUNTER — Other Ambulatory Visit (HOSPITAL_COMMUNITY): Payer: Self-pay | Admitting: Pulmonary Disease

## 2018-08-31 DIAGNOSIS — J90 Pleural effusion, not elsewhere classified: Secondary | ICD-10-CM

## 2018-09-05 ENCOUNTER — Encounter (HOSPITAL_COMMUNITY): Payer: Self-pay

## 2018-09-06 ENCOUNTER — Encounter (HOSPITAL_COMMUNITY): Payer: Self-pay

## 2018-09-17 ENCOUNTER — Ambulatory Visit (HOSPITAL_COMMUNITY): Payer: Self-pay

## 2018-09-19 ENCOUNTER — Ambulatory Visit (HOSPITAL_COMMUNITY)
Admission: RE | Admit: 2018-09-19 | Discharge: 2018-09-19 | Disposition: A | Discharge status: Home / Self Care | Payer: BLUE CROSS/BLUE SHIELD | Source: Ambulatory Visit | Attending: Pulmonary Disease | Admitting: Pulmonary Disease

## 2018-09-19 ENCOUNTER — Other Ambulatory Visit (HOSPITAL_COMMUNITY): Payer: Self-pay | Admitting: Pulmonary Disease

## 2018-09-19 DIAGNOSIS — I2584 Coronary atherosclerosis due to calcified coronary lesion: Secondary | ICD-10-CM | POA: Diagnosis not present

## 2018-09-19 DIAGNOSIS — I700 Atherosclerosis of aorta: Secondary | ICD-10-CM | POA: Insufficient documentation

## 2018-09-19 DIAGNOSIS — J90 Pleural effusion, not elsewhere classified: Secondary | ICD-10-CM | POA: Diagnosis present

## 2018-09-19 DIAGNOSIS — I2510 Atherosclerotic heart disease of native coronary artery without angina pectoris: Secondary | ICD-10-CM | POA: Diagnosis not present

## 2018-09-19 DIAGNOSIS — Q340 Anomaly of pleura: Secondary | ICD-10-CM | POA: Insufficient documentation

## 2018-09-19 DIAGNOSIS — I517 Cardiomegaly: Secondary | ICD-10-CM | POA: Insufficient documentation

## 2018-09-19 DIAGNOSIS — J9811 Atelectasis: Secondary | ICD-10-CM | POA: Insufficient documentation

## 2018-09-19 LAB — POCT I-STAT CREATININE: CREATININE: 0.8 mg/dL (ref 0.44–1.00)

## 2018-09-19 MED ORDER — IOHEXOL 300 MG/ML  SOLN
75.0000 mL | Freq: Once | INTRAMUSCULAR | Status: AC | PRN
  Administered 2018-09-19: 75 mL via INTRAVENOUS

## 2018-10-03 ENCOUNTER — Ambulatory Visit (HOSPITAL_COMMUNITY)
Admission: RE | Admit: 2018-10-03 | Discharge: 2018-10-03 | Disposition: A | Discharge status: Home / Self Care | Payer: BLUE CROSS/BLUE SHIELD | Source: Ambulatory Visit | Attending: Pulmonary Disease | Admitting: Pulmonary Disease

## 2018-10-03 DIAGNOSIS — R0602 Shortness of breath: Secondary | ICD-10-CM | POA: Insufficient documentation

## 2018-10-03 LAB — PULMONARY FUNCTION TEST
DL/VA % PRED: 136 %
DL/VA: 6 ml/min/mmHg/L
DLCO UNC % PRED: 54 %
DLCO unc: 11.12 ml/min/mmHg
FEF 25-75 Post: 1.6 L/sec
FEF 25-75 Pre: 1.16 L/sec
FEF2575-%CHANGE-POST: 38 %
FEF2575-%PRED-POST: 77 %
FEF2575-%PRED-PRE: 56 %
FEV1-%Change-Post: 6 %
FEV1-%PRED-POST: 52 %
FEV1-%Pred-Pre: 48 %
FEV1-PRE: 0.95 L
FEV1-Post: 1.02 L
FEV1FVC-%Change-Post: 0 %
FEV1FVC-%PRED-PRE: 107 %
FEV6-%CHANGE-POST: 6 %
FEV6-%Pred-Post: 49 %
FEV6-%Pred-Pre: 46 %
FEV6-PRE: 1.1 L
FEV6-Post: 1.17 L
FEV6FVC-%PRED-PRE: 103 %
FEV6FVC-%Pred-Post: 103 %
FVC-%CHANGE-POST: 6 %
FVC-%PRED-POST: 47 %
FVC-%Pred-Pre: 44 %
FVC-Post: 1.17 L
FVC-Pre: 1.1 L
POST FEV1/FVC RATIO: 87 %
Post FEV6/FVC ratio: 100 %
Pre FEV1/FVC ratio: 87 %
Pre FEV6/FVC Ratio: 100 %
RV % pred: 265 %
RV: 4.62 L
TLC % pred: 128 %
TLC: 5.89 L

## 2018-10-03 MED ORDER — ALBUTEROL SULFATE (2.5 MG/3ML) 0.083% IN NEBU
2.5000 mg | INHALATION_SOLUTION | Freq: Once | RESPIRATORY_TRACT | Status: AC
  Administered 2018-10-03: 2.5 mg via RESPIRATORY_TRACT

## 2019-04-12 ENCOUNTER — Other Ambulatory Visit: Payer: Self-pay | Admitting: Pulmonary Disease

## 2019-04-12 ENCOUNTER — Other Ambulatory Visit (HOSPITAL_COMMUNITY): Payer: Self-pay | Admitting: Pulmonary Disease

## 2019-04-16 ENCOUNTER — Other Ambulatory Visit: Payer: Self-pay | Admitting: Pulmonary Disease

## 2019-04-16 ENCOUNTER — Other Ambulatory Visit (HOSPITAL_COMMUNITY): Payer: Self-pay | Admitting: Pulmonary Disease

## 2019-04-16 DIAGNOSIS — R9389 Abnormal findings on diagnostic imaging of other specified body structures: Secondary | ICD-10-CM

## 2019-06-17 ENCOUNTER — Ambulatory Visit (HOSPITAL_COMMUNITY): Admission: RE | Admit: 2019-06-17 | Payer: BLUE CROSS/BLUE SHIELD | Source: Ambulatory Visit

## 2019-06-17 ENCOUNTER — Encounter (HOSPITAL_COMMUNITY): Payer: Self-pay

## 2019-07-01 ENCOUNTER — Other Ambulatory Visit: Payer: Self-pay

## 2019-07-01 ENCOUNTER — Ambulatory Visit (HOSPITAL_COMMUNITY)
Admission: RE | Admit: 2019-07-01 | Discharge: 2019-07-01 | Disposition: A | Discharge status: Home / Self Care | Payer: BLUE CROSS/BLUE SHIELD | Source: Ambulatory Visit | Attending: Pulmonary Disease | Admitting: Pulmonary Disease

## 2019-07-01 DIAGNOSIS — R9389 Abnormal findings on diagnostic imaging of other specified body structures: Secondary | ICD-10-CM | POA: Diagnosis not present

## 2019-07-01 LAB — POCT I-STAT CREATININE: Creatinine, Ser: 0.8 mg/dL (ref 0.44–1.00)

## 2019-07-01 MED ORDER — IOHEXOL 300 MG/ML  SOLN
75.0000 mL | Freq: Once | INTRAMUSCULAR | Status: AC | PRN
  Administered 2019-07-01: 75 mL via INTRAVENOUS

## 2019-11-27 ENCOUNTER — Other Ambulatory Visit (HOSPITAL_COMMUNITY): Payer: Self-pay | Admitting: Pulmonary Disease

## 2019-11-27 ENCOUNTER — Other Ambulatory Visit: Payer: Self-pay | Admitting: Pulmonary Disease

## 2019-11-27 DIAGNOSIS — J90 Pleural effusion, not elsewhere classified: Secondary | ICD-10-CM

## 2019-12-04 ENCOUNTER — Ambulatory Visit (HOSPITAL_COMMUNITY)
Admission: RE | Admit: 2019-12-04 | Discharge: 2019-12-04 | Disposition: A | Discharge status: Home / Self Care | Payer: BLUE CROSS/BLUE SHIELD | Source: Ambulatory Visit | Attending: Pulmonary Disease | Admitting: Pulmonary Disease

## 2019-12-04 ENCOUNTER — Other Ambulatory Visit: Payer: Self-pay

## 2019-12-04 DIAGNOSIS — J90 Pleural effusion, not elsewhere classified: Secondary | ICD-10-CM | POA: Insufficient documentation

## 2020-08-12 ENCOUNTER — Encounter: Payer: Self-pay | Admitting: Internal Medicine

## 2020-08-12 ENCOUNTER — Other Ambulatory Visit: Payer: Self-pay

## 2020-08-12 ENCOUNTER — Other Ambulatory Visit (HOSPITAL_COMMUNITY)
Admission: RE | Admit: 2020-08-12 | Discharge: 2020-08-12 | Disposition: A | Discharge status: Home / Self Care | Payer: BLUE CROSS/BLUE SHIELD | Source: Ambulatory Visit | Attending: Internal Medicine | Admitting: Internal Medicine

## 2020-08-12 ENCOUNTER — Ambulatory Visit (HOSPITAL_COMMUNITY)
Admission: RE | Admit: 2020-08-12 | Discharge: 2020-08-12 | Disposition: A | Discharge status: Home / Self Care | Payer: BLUE CROSS/BLUE SHIELD | Source: Ambulatory Visit | Attending: Internal Medicine | Admitting: Internal Medicine

## 2020-08-12 ENCOUNTER — Ambulatory Visit: Payer: BLUE CROSS/BLUE SHIELD | Admitting: Internal Medicine

## 2020-08-12 DIAGNOSIS — M069 Rheumatoid arthritis, unspecified: Secondary | ICD-10-CM | POA: Diagnosis not present

## 2020-08-12 DIAGNOSIS — R0609 Other forms of dyspnea: Secondary | ICD-10-CM

## 2020-08-12 DIAGNOSIS — R0600 Dyspnea, unspecified: Secondary | ICD-10-CM | POA: Insufficient documentation

## 2020-08-12 DIAGNOSIS — I10 Essential (primary) hypertension: Secondary | ICD-10-CM

## 2020-08-12 LAB — TSH: TSH: 1.475 u[IU]/mL (ref 0.350–4.500)

## 2020-08-12 LAB — BASIC METABOLIC PANEL
Anion gap: 10 (ref 5–15)
BUN: 16 mg/dL (ref 6–20)
CO2: 30 mmol/L (ref 22–32)
Calcium: 8.9 mg/dL (ref 8.9–10.3)
Chloride: 97 mmol/L — ABNORMAL LOW (ref 98–111)
Creatinine, Ser: 0.72 mg/dL (ref 0.44–1.00)
GFR calc Af Amer: >60 mL/min (ref >60)
GFR calc non Af Amer: >60 mL/min (ref >60)
Glucose, Bld: 103 mg/dL — ABNORMAL HIGH (ref 70–99)
Potassium: 3.8 mmol/L (ref 3.5–5.1)
Sodium: 137 mmol/L (ref 135–145)

## 2020-08-12 LAB — CBC WITH DIFFERENTIAL/PLATELET
Abs Immature Granulocytes: 0.12 10*3/uL — ABNORMAL HIGH (ref 0.00–0.07)
Basophils Absolute: 0 10*3/uL (ref 0.0–0.1)
Basophils Relative: 0 %
Eosinophils Absolute: 0.3 10*3/uL (ref 0.0–0.5)
Eosinophils Relative: 3 %
HCT: 40.2 % (ref 36.0–46.0)
Hemoglobin: 12.2 g/dL (ref 12.0–15.0)
Immature Granulocytes: 1 %
Lymphocytes Relative: 20 %
Lymphs Abs: 1.8 10*3/uL (ref 0.7–4.0)
MCH: 28.4 pg (ref 26.0–34.0)
MCHC: 30.3 g/dL (ref 30.0–36.0)
MCV: 93.7 fL (ref 80.0–100.0)
Monocytes Absolute: 0.3 10*3/uL (ref 0.1–1.0)
Monocytes Relative: 3 %
Neutro Abs: 6.7 10*3/uL (ref 1.7–7.7)
Neutrophils Relative %: 73 %
Platelets: 351 10*3/uL (ref 150–400)
RBC: 4.29 MIL/uL (ref 3.87–5.11)
RDW: 13.4 % (ref 11.5–15.5)
WBC: 9.2 10*3/uL (ref 4.0–10.5)
nRBC: 0 % (ref 0.0–0.2)

## 2020-08-12 LAB — SEDIMENTATION RATE: Sed Rate: 25 mm/hr — ABNORMAL HIGH (ref 0–22)

## 2020-08-12 LAB — BRAIN NATRIURETIC PEPTIDE: B Natriuretic Peptide: 58 pg/mL (ref 0.0–100.0)

## 2020-08-12 MED ORDER — ALBUTEROL SULFATE HFA 108 (90 BASE) MCG/ACT IN AERS: 2.0000 | INHALATION_SPRAY | RESPIRATORY_TRACT | 11 refills | Status: DC | PRN

## 2020-08-12 MED ORDER — FUROSEMIDE 40 MG PO TABS
40.0000 mg | ORAL_TABLET | Freq: Every day | ORAL | 11 refills | Status: DC
Start: 2020-08-12 — End: 2021-08-20

## 2020-08-12 MED ORDER — FAMOTIDINE 20 MG PO TABS: ORAL_TABLET | ORAL | 11 refills | Status: DC

## 2020-08-12 MED ORDER — PANTOPRAZOLE SODIUM 40 MG PO TBEC: DELAYED_RELEASE_TABLET | ORAL | 2 refills | Status: DC

## 2020-08-12 NOTE — Assessment & Plan Note (Signed)
Steroid dep since first of 2021 per pt   Doubt RA lung dz but needs to be kept in ddx for sob including caused by vcd related to Cricoartenoiditis,  Bronchiolitis, and ILD > check cxr and will repeat pfts if not improving on gerd rx with regular paced (submax) ex.

## 2020-08-12 NOTE — Patient Instructions (Addendum)
We will walk you today in the office   Only use your albuterol as a rescue medication to be used if you can't catch your breath by resting or doing a relaxed purse lip breathing pattern.  - The less you use it, the better it will work when you need it. - Ok to use up to 2 puffs  every 4 hours if you must but call for immediate appointment if use goes up over your usual need - Don't leave home without it !!  (think of it like the spare tire for your car)   Try albuterol 15 min before an activity that you know would make you short of breath and see if it makes any difference and if makes none then don't take it after activity unless you can't catch your breath.  Work on inhaler technique:  relax and gently blow all the way out then take a nice smooth deep breath back in, triggering the inhaler at same time you start breathing in.  Hold for up to 5 seconds if you can.   Pantoprazole (protonix) 40 mg   Take  30-60 min before first meal of the day and Pepcid (famotidine)  20 mg one @  bedtime until return to office - this is the best way to tell whether stomach acid is contributing to your problem.     GERD (REFLUX)  is an extremely common cause of respiratory symptoms just like yours , many times with no obvious heartburn at all.    It can be treated with medication, but also with lifestyle changes including elevation of the head of your bed (ideally with 6 -8inch blocks under the headboard of your bed),  Smoking cessation, avoidance of late meals, excessive alcohol, and avoid fatty foods, chocolate, peppermint, colas, red wine, and acidic juices such as orange juice.  NO MINT OR MENTHOL PRODUCTS SO NO COUGH DROPS  USE SUGARLESS CANDY INSTEAD (Jolley ranchers or Stover's or Life Savers) or even ice chips will also do - the key is to swallow to prevent all throat clearing. NO OIL BASED VITAMINS - use powdered substitutes.  Avoid fish oil when coughing.     Please remember to go to the lab and x-ray  department at [facility]   for your tests - we will call you with the results when they are available.      Please schedule a follow up office visit in 6 weeks, call sooner if needed

## 2020-08-12 NOTE — Progress Notes (Signed)
Anita Mosley, female    DOB: 08-28-1961     MRN: [id number]   Brief patient profile: 24 yobf never smoker  developed cough/ wheeze around 2016  which developed while on ACEi which was stopped around  2020 and replaced with losartan and maint on trelegy until July 2021 when ran out  and self referred back to pulmonary clinic in [facility]  08/12/2020 where used to see Dr Luan Pulling stating can't tell any difference off trelegy with absence of airflow obst on prior pfts from 09/2018 with ERV 27%      History of Present Illness  08/12/2020  Pulmonary/ 1st office eval/Gaelen Brager  [place]  Chief Complaint  Patient presents with  . Follow-up    Self referral- former Dr Luan Pulling pt- needs rx refills on proair, lasix and trelegy. She has been out of these meds x 2 months and breathing has been some worse. She has some cough and wheezing- cough with clear to yellow sputum.   Dyspnea:  MMRC3 = can't walk 100 yards even at a slow pace at a flat grade s stopping due to sob   eg [employer]  Cough: mostly hs no worse since stopped trelegy / wakes up middle of night / min beige mucus Sleep: bed is flat/ 2 pillow SABA use: once every other  Other day  Prednisone 5 mg daily < one year for RA helps some   No obvious day to day or daytime variability or assoc   mucus plugs or hemoptysis or cp or chest tightness, subjective wheeze or overt sinus or hb symptoms.    . Also denies any obvious fluctuation of symptoms with weather or environmental changes or other aggravating or alleviating factors except as outlined above   No unusual exposure hx or h/o childhood pna/ asthma or knowledge of premature birth.  Current Allergies, Complete Past Medical History, Past Surgical History, Family History, and Social History were reviewed in [employer] record.  ROS  The following are not active complaints unless bolded Hoarseness, sore throat, dysphagia, dental problems, itching, sneezing,  nasal congestion or  discharge of excess mucus or purulent secretions, ear ache,   fever, chills, sweats, unintended wt loss or wt gain, classically pleuritic or exertional cp,  orthopnea pnd or arm/hand swelling  or leg swelling, presyncope, palpitations, abdominal pain, anorexia, nausea, vomiting, diarrhea  or change in bowel habits or change in bladder habits, change in stools or change in urine, dysuria, hematuria,  rash, arthralgias, visual complaints, headache, numbness, weakness or ataxia or problems with walking or coordination,  change in mood or  memory.           Past Medical History:  Diagnosis Date  . Anemia   . Fibroids   . Hypertension     Outpatient Medications Prior to Visit  Medication Sig Dispense Refill  .       . furosemide (LASIX) 40 MG tablet Take 40 mg by mouth daily.    .       . Multiple Vitamins-Calcium (ONE-A-DAY WOMENS PO) Take 1 tablet by mouth daily as needed (as supplement).    Marland Kitchen albuterol (PROAIR HFA) 108 (90 Base) MCG/ACT inhaler Inhale 2 puffs into the lungs every 6 (six) hours as needed for wheezing or shortness of breath. (Patient not taking: Reported on 08/12/2020)    . atenolol (TENORMIN) 25 MG tablet Take 25 mg by mouth daily. (Patient not taking: Reported on 08/12/2020)    . ferrous sulfate 325 (65 FE)  MG tablet Take 1 tablet (325 mg total) by mouth 2 (two) times daily. (Patient not taking: Reported on 08/12/2020) 60 tablet 0      Objective:     BP 126/70 (BP Location: Left Arm, Cuff Size: Large)   Pulse 80   Temp (!) 97.3 F (36.3 C) (Temporal)   Ht 5\' 2"  (1.575 m)   Wt 224 lb (101.6 kg)   LMP 05/10/2013   SpO2 96% Comment: on RA  BMI 40.97 kg/m   SpO2: 96 % (on RA)   Obese bf nad/ mild pseudowheeze    HEENT : pt wearing mask not removed for exam due to covid -19 concerns.    NECK :  without JVD/Nodes/TM/ nl carotid upstrokes bilaterally   LUNGS: no acc muscle use,  Nl contour chest which is clear to A and P bilaterally without cough on insp or exp  maneuvers   CV:  RRR  no s3 or murmur or increase in P2, and  Trace sym ankle  edema   ABD:  Quite obese soft and nontender with limited inspiratory excursion in the supine position. No bruits or organomegaly appreciated, bowel sounds nl  MS:  Nl gait/ ext warm without deformities, calf tenderness, cyanosis or clubbing No obvious joint restrictions   SKIN: warm and dry without lesions    NEURO:  alert, approp, nl sensorium with  no motor or cerebellar deficits apparent.     CXR PA and Lateral:   08/12/2020 :    I personally reviewed images and agree with radiology impression as follows:   1. Slight increase in pleuroparenchymal opacification in the left costophrenic angle may be due to scarring. Difficult to exclude increasing loculated pleural fluid. CT chest without contrast could be performed in further evaluation, as clinically indicated. 2. Mild scarring at the base of the right hemithorax.   Labs ordered/ reviewed:      Chemistry      Component Value Date/Time   NA 137 08/12/2020 1042   K 3.8 08/12/2020 1042   CL 97 (L) 08/12/2020 1042   CO2 30 08/12/2020 1042   BUN 16 08/12/2020 1042   CREATININE 0.72 08/12/2020 1042      Component Value Date/Time   CALCIUM 8.9 08/12/2020 1042                             Lab Results  Component Value Date   WBC 9.2 08/12/2020   HGB 12.2 08/12/2020   HCT 40.2 08/12/2020   MCV 93.7 08/12/2020   PLT 351 08/12/2020       EOS                                                               0.3                                    08/12/2020      Lab Results  Component Value Date   TSH 1.475 08/12/2020       PROBNP  08/12/2020  58     Lab Results  Component Value Date   ESRSEDRATE 25 (H) 08/12/2020  Assessment   DOE (dyspnea on exertion) Onset around 2016 while on ACEi stopped in 2020  - PFT's   10/03/18 FEV1 1.02 (52 % ) ratio 0.87  p 6 % improvement from saba p ? prior to study with DLCO  11.12 (54%)  corrects to 6 (136%)  for alv volume and FV curve no significant concavity with ERV  27%   - 08/12/2020  After extensive coaching inhaler device,  effectiveness =    50% from a baseline of 0 with proair device stopped up  -  08/12/2020   Walked RA  approx   500 ft  @ fast pace  stopped due to  End of study with sats 94% and min sob    Symptoms are markedly disproportionate to objective findings and not clear to what extent this is actually a pulmonary  problem but pt does appear to have difficult to sort out respiratory symptoms of unknown origin for which  DDX  = almost all start with A and  include Adherence, Ace Inhibitors, Acid Reflux, Active Sinus Disease, Alpha 1 Antitripsin deficiency, Anxiety masquerading as Airways dz,  ABPA,  Allergy(esp in young), Aspiration (esp in elderly), Adverse effects of meds,  Active smoking or Vaping, A bunch of PE's/clot burden (a few small clots can't cause this syndrome unless there is already severe underlying pulm or vascular dz with poor reserve),  Anemia or thyroid disorder, plus two Bs  = Bronchiectasis and Beta blocker use..and one C= CHF     Adherence is always the initial "prime suspect" and is a multilayered concern that requires a "trust but verify" approach in every patient - starting with knowing how to use medications, especially inhalers, correctly, keeping up with refills and understanding the fundamental difference between maintenance and prns vs those medications only taken for a very short course and then stopped and not refilled.  - see hfa teaching - return with all meds in hand using a trust but verify approach to confirm accurate Medication  Reconciliation The principal here is that until we are certain that the  patients are doing what we've asked, it makes no sense to ask them to do more.    ? Adverse drug effects > clearly does not need trelegy/ no longer on acei but need to keep in mind For reasons that may related to vascular permability  and nitric oxide pathways but not elevated  bradykinin levels (as seen with  ACEi use) losartan in the generic form has been reported now from mulitple sources  to cause a similar pattern of non-specific  upper airway symptoms as seen with acei.   This has not been reported with exposure to the other ARB's to date, so may need  to try either generic diovan or avapro if ARB needed or use an alternative class altogether.  See:  Lelon Frohlich Allergy Asthma Immunol  2008: 101: p [phone]    ? Acid (or non-acid) GERD > always difficult to exclude as up to 75% of pts in some series report no assoc GI/ Heartburn symptoms> rec max (24h)  acid suppression and diet restrictions/ reviewed and instructions given in writing.   ? Allergy / asthma > not likely since on pred for RA > check allergy profile I spent extra time with pt today reviewing appropriate use of albuterol for prn use on exertion with the following points: 1) saba is for relief of sob that does not improve by walking a slower pace or resting but rather if  the pt does not improve after trying this first. 2) If the pt is convinced, as many are, that saba helps recover from activity faster then it's easy to tell if this is the case by re-challenging : ie stop, take the inhaler, then p 5 minutes try the exact same activity (intensity of workload) that just caused the symptoms and see if they are substantially diminished or not after saba 3) if there is an activity that reproducibly causes the symptoms, try the saba 15 min before the activity on alternate days   If in fact the saba really does help, then fine to continue to use it prn but advised may need to look closer at the maintenance regimen being used to achieve better control of airways disease with exertion.   Anemia/ thryoid dz> ruled out today   ? A bunch of PE's > not likely based on chronicity and walking study today   ? chf > ruled out by cxr and bnp today     Rheumatoid arthritis  ([place]) Steroid dep since first of 2021 per pt   Doubt RA lung dz but needs to be kept in ddx for sob including caused by vcd related to Cricoartenoiditis,  Bronchiolitis, and ILD > check cxr and will repeat pfts if not improving on gerd rx with regular paced (submax) ex.     Morbid obesity due to excess calories (HCC) Body mass index is 40.97 kg/m.  With ERV disproportionately reduced on lung volumes 10/03/18  Lab Results  Component Value Date   TSH 1.088 10/25/2013    Contributing to gerd risk/ doe/reviewed the need and the process to achieve and maintain neg calorie balance > defer f/u primary care including intermittently monitoring thyroid status      Essential hypertension Lab Results  Component Value Date   CREATININE 0.80 07/01/2019   CREATININE 0.80 09/19/2018   CREATININE 1.23 (H) 07/17/2013     For now will refill  lasix 40 mg daily / low threshold for echo if swelling not well controlled      Medical decision making was a high  level of complexity in this case because of  two chronic conditions /diagnoses requiring extra time for  H and P, chart review, counseling,  directly observing portions of ambulatory 02 saturation study/   and generating customized AVS unique to this office visit and charting.   Each maintenance medication was reviewed in detail including emphasizing most importantly the difference between maintenance and prns and under what circumstances the prns are to be triggered using an action plan format where appropriate. Please see avs for details which were reviewed in writing by both me and my nurse and patient given a written copy highlighted where appropriate with yellow highlighter for the patient's continued care at home along with an updated version of their medications.  Patient was asked to maintain medication reconciliation by comparing this list to the actual medications being used at home and to contact this office right away if there is a  conflict or discrepancy.        Christinia Gully, MD 08/12/2020

## 2020-08-12 NOTE — Assessment & Plan Note (Addendum)
Lab Results  Component Value Date   CREATININE 0.80 07/01/2019   CREATININE 0.80 09/19/2018   CREATININE 1.23 (H) 07/17/2013     For now will refill  lasix 40 mg daily / low threshold for echo if swelling not well controlled    Medical decision making was a hihg level of complexity in this case because of  two chronic conditions /diagnoses requiring extra time for  H and P, chart review, counseling,  directly observing portions of ambulatory 02 saturation study/   and generating customized AVS unique to this office visit and charting.   Each maintenance medication was reviewed in detail including emphasizing most importantly the difference between maintenance and prns and under what circumstances the prns are to be triggered using an action plan format where appropriate. Please see avs for details which were reviewed in writing by both me and my nurse and patient given a written copy highlighted where appropriate with yellow highlighter for the patient's continued care at home along with an updated version of their medications.  Patient was asked to maintain medication reconciliation by comparing this list to the actual medications being used at home and to contact this office right away if there is a conflict or discrepancy.

## 2020-08-12 NOTE — Assessment & Plan Note (Addendum)
Body mass index is 40.97 kg/m.  With ERV disproportionately reduced on lung volumes 10/03/18  Lab Results  Component Value Date   TSH 1.088 10/25/2013     Contributing to gerd risk/ doe/reviewed the need and the process to achieve and maintain neg calorie balance > defer f/u primary care including intermittently monitoring thyroid status

## 2020-08-12 NOTE — Assessment & Plan Note (Addendum)
Onset around 2016 while on ACEi stopped in 2020  - PFT's   10/03/18 FEV1 1.02 (52 % ) ratio 0.87  p 6 % improvement from saba p ? prior to study with DLCO  11.12 (54%) corrects to 6 (136%)  for alv volume and FV curve no significant concavity with ERV  27%   - 08/12/2020  After extensive coaching inhaler device,  effectiveness =    50% from a baseline of 0 with proair device stopped up  -  08/12/2020   Walked RA  approx   500 ft  @ fast pace  stopped due to  End of study with sats 94% and min sob    Symptoms are markedly disproportionate to objective findings and not clear to what extent this is actually a pulmonary  problem but pt does appear to have difficult to sort out respiratory symptoms of unknown origin for which  DDX  = almost all start with A and  include Adherence, Ace Inhibitors, Acid Reflux, Active Sinus Disease, Alpha 1 Antitripsin deficiency, Anxiety masquerading as Airways dz,  ABPA,  Allergy(esp in young), Aspiration (esp in elderly), Adverse effects of meds,  Active smoking or Vaping, A bunch of PE's/clot burden (a few small clots can't cause this syndrome unless there is already severe underlying pulm or vascular dz with poor reserve),  Anemia or thyroid disorder, plus two Bs  = Bronchiectasis and Beta blocker use..and one C= CHF     Adherence is always the initial "prime suspect" and is a multilayered concern that requires a "trust but verify" approach in every patient - starting with knowing how to use medications, especially inhalers, correctly, keeping up with refills and understanding the fundamental difference between maintenance and prns vs those medications only taken for a very short course and then stopped and not refilled.  - see hfa teaching - return with all meds in hand using a trust but verify approach to confirm accurate Medication  Reconciliation The principal here is that until we are certain that the  patients are doing what we've asked, it makes no sense to ask them to  do more.    ? Adverse drug effects > clearly does not need trelegy/ no longer on acei but need to keep in mind For reasons that may related to vascular permability and nitric oxide pathways but not elevated  bradykinin levels (as seen with  ACEi use) losartan in the generic form has been reported now from mulitple sources  to cause a similar pattern of non-specific  upper airway symptoms as seen with acei.   This has not been reported with exposure to the other ARB's to date, so may need  to try either generic diovan or avapro if ARB needed or use an alternative class altogether.  See:  Lelon Frohlich Allergy Asthma Immunol  2008: 101: p [phone]    ? Acid (or non-acid) GERD > always difficult to exclude as up to 75% of pts in some series report no assoc GI/ Heartburn symptoms> rec max (24h)  acid suppression and diet restrictions/ reviewed and instructions given in writing.   ? Allergy / asthma > not likely since on pred for RA > check allergy profile I spent extra time with pt today reviewing appropriate use of albuterol for prn use on exertion with the following points: 1) saba is for relief of sob that does not improve by walking a slower pace or resting but rather if the pt does not improve after trying this  first. 2) If the pt is convinced, as many are, that saba helps recover from activity faster then it's easy to tell if this is the case by re-challenging : ie stop, take the inhaler, then p 5 minutes try the exact same activity (intensity of workload) that just caused the symptoms and see if they are substantially diminished or not after saba 3) if there is an activity that reproducibly causes the symptoms, try the saba 15 min before the activity on alternate days   If in fact the saba really does help, then fine to continue to use it prn but advised may need to look closer at the maintenance regimen being used to achieve better control of airways disease with exertion.   Anemia/ thryoid dz> ruled out  today   ? A bunch of PE's > not likely based on chronicity and walking study today   ? chf > ruled out by cxr and bnp today

## 2020-08-13 NOTE — Progress Notes (Signed)
Spoke with the pt's niece ok per DPR and notified of results per MW and she verbalized understanding and will inform the pt.

## 2020-08-14 LAB — IGE: IgE (Immunoglobulin E), Serum: 24 IU/mL (ref 6–495)

## 2020-08-14 NOTE — Progress Notes (Signed)
Called and left message on voicemail to please call back. Contact phone number given.

## 2020-08-18 NOTE — Progress Notes (Signed)
Spoke with the pt's niece ok per DPR and notified of results and she verbalized understanding and will inform the pt.

## 2020-08-28 ENCOUNTER — Encounter: Payer: Self-pay | Admitting: *Deleted

## 2020-08-28 DIAGNOSIS — K219 Gastro-esophageal reflux disease without esophagitis: Secondary | ICD-10-CM | POA: Insufficient documentation

## 2020-08-29 ENCOUNTER — Encounter: Payer: Self-pay | Admitting: *Deleted

## 2020-09-02 ENCOUNTER — Telehealth: Payer: Self-pay | Admitting: *Deleted

## 2020-09-02 NOTE — Telephone Encounter (Signed)
PA sent to [facility] for Pantoprazole.  This has been denied by the insurance.  MW please advise. Thanks

## 2020-09-02 NOTE — Telephone Encounter (Signed)
Called and spoke with patient's niece Anita Mosley per DPR to let her know that PA for Pantoprazole was denied by her insurance. Also let her know that we were going to suggest a different medication but looks like that is not covered by her insurance either. Asked her to call insurance company to ask for the formulary to see that medications are covered. She expressed understanding. Also let her know that in the mean time she could take Prilosec OTC 20 mg BID either until her appointment in October or to call us once they have the list of covered meds. She expressed understanding. Nothing further needed at this time.

## 2020-09-02 NOTE — Telephone Encounter (Signed)
Can try subsitute omeprazole 40 but if company won't even pay for that then suggest prilosec 20 mg otc bid until returns for ov with formulary in hand

## 2020-09-24 ENCOUNTER — Encounter: Payer: Self-pay | Admitting: Internal Medicine

## 2020-09-24 ENCOUNTER — Other Ambulatory Visit: Payer: Self-pay

## 2020-09-24 ENCOUNTER — Ambulatory Visit: Payer: BLUE CROSS/BLUE SHIELD | Admitting: Internal Medicine

## 2020-09-24 DIAGNOSIS — R0609 Other forms of dyspnea: Secondary | ICD-10-CM

## 2020-09-24 DIAGNOSIS — R0600 Dyspnea, unspecified: Secondary | ICD-10-CM | POA: Diagnosis not present

## 2020-09-24 NOTE — Progress Notes (Signed)
Anita Mosley, female    DOB:  07-Feb-1961     MRN: [id number]   Brief patient profile: 45 yobf never smoker  developed cough/ wheeze around 2016  which developed while on ACEi which was stopped around  2020 and replaced with losartan and maint on trelegy until July 2021 when ran out  and self referred back to pulmonary clinic in [facility]  08/12/2020 where used to see Dr Luan Pulling stating can't tell any difference off trelegy with absence of airflow obst on prior pfts from 09/2018 with ERV 27%      History of Present Illness  08/12/2020  Pulmonary/ 1st office eval/Anita Mosley  [place]  Chief Complaint  Patient presents with  . Follow-up    Self referral- former Dr Luan Pulling pt- needs rx refills on proair, lasix and trelegy. She has been out of these meds x 2 months and breathing has been some worse. She has some cough and wheezing- cough with clear to yellow sputum.   Dyspnea:  MMRC3 = can't walk 100 yards even at a slow pace at a flat grade s stopping due to sob   eg [employer]  Cough: mostly hs no worse since stopped trelegy / wakes up middle of night / min beige mucus Sleep: bed is flat/ 2 pillow SABA use: once every other  Other day  Prednisone 5 mg daily < one year for RA helps some  rec We will walk you today in the office  Only use your albuterol as a rescue medication  Try albuterol 15 min before an activity that you know would make you short of breath and see if it makes any difference and if makes none then don't take it after activity unless you can't catch your breath. Work on inhaler technique:   Pantoprazole (protonix) 40 mg   Take  30-60 min before first meal of the day and Pepcid (famotidine)  20 mg one @  bedtime  GERD diet      09/24/2020  f/u ov/Anita Mosley re:  Continues to do better off trelegy  Chief Complaint  Patient presents with  . Follow-up    Breathing has improved some. She has occ cough with clear to yellow sputum and wheezing. She rarely uses her albuterol inhaler.   Dyspnea:   Not limited by breathing from desired activities   Cough: no  Sleeping    Not on bed blocks  SABA use: avg twice daily ? Why , never rechallenges but same as was the case on trelegy  02: none  Never took any gerd meds    No obvious day to day or daytime variability or assoc excess/ purulent sputum or mucus plugs or hemoptysis or cp or chest tightness, subjective wheeze or overt sinus or hb symptoms.   Sleeping without nocturnal  or early am exacerbation  of respiratory  c/o's or need for noct saba. Also denies any obvious fluctuation of symptoms with weather or environmental changes or other aggravating or alleviating factors except as outlined above   No unusual exposure hx or h/o childhood pna/ asthma or knowledge of premature birth.  Current Allergies, Complete Past Medical History, Past Surgical History, Family History, and Social History were reviewed in [employer] record.  ROS  The following are not active complaints unless bolded Hoarseness, sore throat, dysphagia, dental problems, itching, sneezing,  nasal congestion or discharge of excess mucus or purulent secretions, ear ache,   fever, chills, sweats, unintended wt loss or wt gain, classically pleuritic  or exertional cp,  orthopnea pnd or arm/hand swelling  or leg swelling, presyncope, palpitations, abdominal pain, anorexia, nausea, vomiting, diarrhea  or change in bowel habits or change in bladder habits, change in stools or change in urine, dysuria, hematuria,  rash, arthralgias, visual complaints, headache, numbness, weakness or ataxia or problems with walking or coordination,  change in mood or  memory.        Current Meds  Medication Sig  . albuterol (PROAIR HFA) 108 (90 Base) MCG/ACT inhaler Inhale 2 puffs into the lungs every 4 (four) hours as needed for wheezing or shortness of breath.  Marland Kitchen atenolol (TENORMIN) 25 MG tablet Take 25 mg by mouth daily.   . ferrous sulfate 325 (65 FE) MG tablet Take 1  tablet (325 mg total) by mouth 2 (two) times daily.  . furosemide (LASIX) 40 MG tablet Take 1 tablet (40 mg total) by mouth daily.  Marland Kitchen LOSARTAN POTASSIUM PO Take 1 tablet by mouth daily.  . Multiple Vitamins-Calcium (ONE-A-DAY WOMENS PO) Take 1 tablet by mouth daily as needed (as supplement).                         Past Medical History:  Diagnosis Date  . Anemia   . Fibroids   . Hypertension       Objective:     Wt Readings from Last 3 Encounters:  09/24/20 220 lb (99.8 kg)  08/12/20 224 lb (101.6 kg)  11/01/13 194 lb (88 kg)     Vital signs reviewed - Note on arrival 09/24/2020  02 sats  99% on RA         HEENT : pt wearing mask not removed for exam due to covid -19 concerns.    NECK :  without JVD/Nodes/TM/ nl carotid upstrokes bilaterally   LUNGS: no acc muscle use,  Nl contour chest which is clear to A and P bilaterally without cough on insp or exp maneuvers   CV:  RRR  no s3 or murmur or increase in P2, and no edema   ABD: obese  soft and nontender with nl inspiratory excursion in the supine position. No bruits or organomegaly appreciated, bowel sounds nl  MS:  Nl gait/ ext warm without deformities, calf tenderness, cyanosis or clubbing No obvious joint restrictions   SKIN: warm and dry without lesions    NEURO:  alert, approp, nl sensorium with  no motor or cerebellar deficits apparent.                      Assessment

## 2020-09-24 NOTE — Patient Instructions (Signed)
Bed blocks 6-8 inches under head board   Only use your albuterol(puffer/Ventolin) as a rescue medication to be used if you can't catch your breath by resting or doing a relaxed purse lip breathing pattern.  - The less you use it, the better it will work when you need it. - Ok to use up to 2 puffs  every 4 hours if you must but call for immediate appointment if use goes up over your usual need - Don't leave home without it !!  (think of it like the spare tire for your car)   Try albuterol 15 min before an activity that you know would make you short of breath and see if it makes any difference and if makes none then don't take it after activity unless you can't catch your breath.      Please schedule a follow up visit in 3 months but call sooner if needed with pfts on return

## 2020-09-25 ENCOUNTER — Encounter: Payer: Self-pay | Admitting: Internal Medicine

## 2020-09-25 NOTE — Assessment & Plan Note (Signed)
Onset around 2016 while on ACEi stopped in 2020  - PFT's   10/03/18 FEV1 1.02 (52 % ) ratio 0.87  p 6 % improvement from saba p ? prior to study with DLCO  11.12 (54%) corrects to 6 (136%)  for alv volume and FV curve no significant concavity with ERV  27%   - 08/12/2020  After extensive coaching inhaler device,  effectiveness =    50% from a baseline of 0 with proair device stopped up  -  08/12/2020   Walked RA  approx   500 ft  @ fast pace  stopped due to  End of study with sats 94% and min sob   - Allergy profile  08/12/20  >  Eos 0.3 /  IgE  24  Improved off acei and trelegy but with same perceived dep on saba   Advised: I spent extra time with pt today reviewing appropriate use of albuterol for prn use on exertion with the following points: 1) saba is for relief of sob that does not improve by walking a slower pace or resting but rather if the pt does not improve after trying this first. 2) If the pt is convinced, as many are, that saba helps recover from activity faster then it's easy to tell if this is the case by re-challenging : ie stop, take the inhaler, then p 5 minutes try the exact same activity (intensity of workload) that just caused the symptoms and see if they are substantially diminished or not after saba 3) if there is an activity that reproducibly causes the symptoms, try the saba 15 min before the activity on alternate days   If in fact the saba really does help, then fine to continue to use it prn but advised may need to look closer at the maintenance regimen being used to achieve better control of airways disease with exertion.    >>> f/u with pfts s saba prior

## 2020-09-25 NOTE — Assessment & Plan Note (Addendum)
ERV disproportionately reduced on lung volumes 10/03/18     Body mass index is 40.24 kg/m.  -  Trending down  Lab Results  Component Value Date   TSH 1.475 08/12/2020     Contributing to gerd risk/ doe/reviewed the need and the process to achieve and maintain neg calorie balance > defer f/u primary care including intermittently monitoring thyroid status            Each maintenance medication was reviewed in detail including emphasizing most importantly the difference between maintenance and prns and under what circumstances the prns are to be triggered using an action plan format where appropriate.  Total time for H and P, chart review, counseling, teaching saba device and generating customized AVS unique to this office visit / charting = 23 min

## 2021-08-20 ENCOUNTER — Other Ambulatory Visit: Payer: Self-pay | Admitting: Internal Medicine

## 2021-08-20 NOTE — Telephone Encounter (Signed)
Would you like to refill patients Lasix?

## 2021-09-08 ENCOUNTER — Telehealth: Payer: Self-pay | Admitting: Internal Medicine

## 2021-09-08 ENCOUNTER — Other Ambulatory Visit: Payer: Self-pay

## 2021-09-08 ENCOUNTER — Encounter: Payer: Self-pay | Admitting: Internal Medicine

## 2021-09-08 ENCOUNTER — Ambulatory Visit (HOSPITAL_COMMUNITY)
Admission: RE | Admit: 2021-09-08 | Discharge: 2021-09-08 | Disposition: A | Discharge status: Home / Self Care | Payer: BLUE CROSS/BLUE SHIELD | Source: Ambulatory Visit | Attending: Internal Medicine | Admitting: Internal Medicine

## 2021-09-08 ENCOUNTER — Ambulatory Visit: Payer: BLUE CROSS/BLUE SHIELD | Admitting: Internal Medicine

## 2021-09-08 DIAGNOSIS — R058 Other specified cough: Secondary | ICD-10-CM | POA: Diagnosis not present

## 2021-09-08 DIAGNOSIS — R0600 Dyspnea, unspecified: Secondary | ICD-10-CM | POA: Diagnosis not present

## 2021-09-08 DIAGNOSIS — R0609 Other forms of dyspnea: Secondary | ICD-10-CM

## 2021-09-08 MED ORDER — PANTOPRAZOLE SODIUM 40 MG PO TBEC: 40.0000 mg | DELAYED_RELEASE_TABLET | Freq: Every day | ORAL | 2 refills | Status: DC

## 2021-09-08 MED ORDER — FAMOTIDINE 20 MG PO TABS: ORAL_TABLET | ORAL | 11 refills | Status: DC

## 2021-09-08 NOTE — Telephone Encounter (Signed)
Put in a new PFT order that will exp. 08/2022. Nothing further needed at this time.

## 2021-09-08 NOTE — Progress Notes (Addendum)
Anita Mosley, female    DOB:  03/19/1961     MRN: [id number]   Brief patient profile: 31 yobf never smoker  developed cough/ wheeze around 2016  which developed while on ACEi which was stopped around  2020 and replaced with losartan and maint on trelegy until July 2021 when ran out  and self referred back to pulmonary clinic in [facility]  08/12/2020 where used to see Dr Luan Pulling stating can't tell any difference off trelegy with absence of airflow obst on prior pfts from 09/2018 with ERV 27%      History of Present Illness  08/12/2020  Pulmonary/ 1st office eval/Anita Mosley  [place]  Chief Complaint  Patient presents with   Follow-up    Self referral- former Dr Luan Pulling pt- needs rx refills on proair, lasix and trelegy. She has been out of these meds x 2 months and breathing has been some worse. She has some cough and wheezing- cough with clear to yellow sputum.   Dyspnea:  MMRC3 = can't walk 100 yards even at a slow pace at a flat grade s stopping due to sob   eg [employer]  Cough: mostly hs no worse since stopped trelegy / wakes up middle of night / min beige mucus Sleep: bed is flat/ 2 pillow SABA use: once every other  Other day  Prednisone 5 mg daily < one year for RA helps some  rec We will walk you today in the office  Only use your albuterol as a rescue medication  Try albuterol 15 min before an activity that you know would make you short of breath and see if it makes any difference and if makes none then don't take it after activity unless you can't catch your breath. Work on inhaler technique:   Pantoprazole (protonix) 40 mg   Take  30-60 min before first meal of the day and Pepcid (famotidine)  20 mg one @  bedtime  GERD diet      09/24/2020  f/u ov/Anita Mosley re:  Continues to do better off trelegy  Chief Complaint  Patient presents with   Follow-up    Breathing has improved some. She has occ cough with clear to yellow sputum and wheezing. She rarely uses her albuterol inhaler.   Dyspnea:   Not limited by breathing from desired activities   Cough: no  Sleeping    Not on bed blocks  SABA use: avg twice daily ? Why , never rechallenges but same as was the case on trelegy  02: none  Never took any gerd meds  Rec Bed blocks 6-8 inches under head board  Only use your albuterol(puffer/Ventolin) as a rescue medication  Try albuterol 15 min before an activity that you know would make you short of breath   Please schedule a follow up visit in 3 months but call sooner if needed with pfts on return    09/08/2021  f/u ov/Anita Mosley office/Anita Mosley re: M0 / steroid dep RA/ ? Asthma component Had been using albuterol twice a week / never got pfts/ did not use on day of ov and not on gerd rx (never took it, never improved p last ov)  Chief Complaint  Patient presents with   Follow-up    Patient feels that sometimes she has a hard time swallowing and feels that sometimes her chest is "swollen" after using inhaler. SOB and wheezing has worsened since last visit.   Dyspnea:  can't do one aisle at grocery store  Cough: assoc with  sob  Sleeping: on side with wedge pillow  SABA use: not used today  02: none  Covid status: vax x 2    No obvious day to day or daytime variability or assoc excess/ purulent sputum or mucus plugs or hemoptysis or cp or chest tightness, subjective wheeze or overt sinus or hb symptoms.   Sleeping  without nocturnal  or early am exacerbation  of respiratory  c/o's or need for noct saba. Also denies any obvious fluctuation of symptoms with weather or environmental changes or other aggravating or alleviating factors except as outlined above   No unusual exposure hx or h/o childhood pna/ asthma or knowledge of premature birth.  Current Allergies, Complete Past Medical History, Past Surgical History, Family History, and Social History were reviewed in [employer] record.  ROS  The following are not active complaints unless bolded Hoarseness, sore  throat, dysphagia, dental problems, itching, sneezing,  nasal congestion or discharge of excess mucus or purulent secretions, ear ache,   fever, chills, sweats, unintended wt loss or wt gain, classically pleuritic or exertional cp,  orthopnea pnd or arm/hand swelling  or leg swelling, presyncope, palpitations, abdominal pain, anorexia, nausea, vomiting, diarrhea  or change in bowel habits or change in bladder habits, change in stools or change in urine, dysuria, hematuria,  rash, arthralgias, visual complaints, headache, numbness, weakness or ataxia or problems with walking or coordination,  change in mood or  memory.        Current Meds -  - NOTE:   Unable to verify as accurately reflecting what pt takes    Medication Sig   albuterol (PROAIR HFA) 108 (90 Base) MCG/ACT inhaler Inhale 2 puffs into the lungs every 4 (four) hours as needed for wheezing or shortness of breath.   atenolol (TENORMIN) 25 MG tablet Take 25 mg by mouth daily.    ferrous sulfate 325 (65 FE) MG tablet Take 1 tablet (325 mg total) by mouth 2 (two) times daily.   furosemide (LASIX) 40 MG tablet TAKE 1 TABLET(40 MG) BY MOUTH DAILY   LOSARTAN POTASSIUM PO Take 1 tablet by mouth daily.   Multiple Vitamins-Calcium (ONE-A-DAY WOMENS PO) Take 1 tablet by mouth daily as needed (as supplement).   [DISCONTINUED] famotidine (PEPCID) 20 MG tablet One after supper   [DISCONTINUED] pantoprazole (PROTONIX) 40 MG tablet Take 30-60 min before first meal of the day                            Past Medical History:  Diagnosis Date   Anemia    Fibroids    Hypertension       Objective:      09/08/2021      221   09/24/20 220 lb (99.8 kg)  08/12/20 224 lb (101.6 kg)  11/01/13 194 lb (88 kg)     Vital signs reviewed  09/08/2021  - Note at rest 02 sats  99% on RA   General appearance:    amb bf with very poor insight into meds/symptoms with pseudowheeze only    HEENT : pt wearing mask not removed for exam due to covid -19  concerns.    NECK :  without JVD/Nodes/TM/ nl carotid upstrokes bilaterally   LUNGS: no acc muscle use,  Nl contour chest which is clear to A and P bilaterally without cough on insp or exp maneuvers   CV:  RRR  no s3 or murmur or increase in  P2, and no edema   ABD:  Obese soft and nontender with nl inspiratory excursion in the supine position. No bruits or organomegaly appreciated, bowel sounds nl  MS:  Nl gait/ ext warm without deformities, calf tenderness, cyanosis or clubbing No obvious joint restrictions   SKIN: warm and dry without lesions    NEURO:  alert, approp, nl sensorium with  no motor or cerebellar deficits apparent.          CXR PA and Lateral:   09/08/2021 :    I personally reviewed images and  impression as follows:    Decreased lung vol bilaterally with ssa R > L base                 Assessment

## 2021-09-08 NOTE — Patient Instructions (Addendum)
Only use your albuterol as a rescue medication to be used if you can't catch your breath by resting or doing a relaxed purse lip breathing pattern.  - The less you use it, the better it will work when you need it. - Ok to use up to 2 puffs  every 4 hours if you must but call for immediate appointment if use goes up over your usual need - Don't leave home without it !!  (think of it like the spare tire for your car)   Ok to try albuterol 15 min before an activity (on alternating days)  that you know would usually make you short of breath and see if it makes any difference and if makes none then don't take albuterol after activity unless you can't catch your breath as this means it's the resting that helps, not the albuterol.      Pantoprazole (protonix) 40 mg   Take  30-60 min before first meal of the day and Pepcid (famotidine)  20 mg after supper until return to office - this is the best way to tell whether stomach acid is contributing to your problem.    GERD (REFLUX)  is an extremely common cause of respiratory symptoms just like yours , many times with no obvious heartburn at all.    It can be treated with medication, but also with lifestyle changes including elevation of the head of your bed (ideally with 6 -8inch blocks under the headboard of your bed),  Smoking cessation, avoidance of late meals, excessive alcohol, and avoid fatty foods, chocolate, peppermint, colas, red wine, and acidic juices such as orange juice.  NO MINT OR MENTHOL PRODUCTS SO NO COUGH DROPS  USE SUGARLESS CANDY INSTEAD (Jolley ranchers or Stover's or Life Savers) or even ice chips will also do - the key is to swallow to prevent all throat clearing. NO OIL BASED VITAMINS - use powdered substitutes.  Avoid fish oil when coughing.   Please remember to go to the  x-ray department  @  [facility] for your tests - we will call you with the results when they are available     Schedule PFTs as soon as  possible  Please schedule a follow up office visit in 6 weeks, call sooner if needed with all medications /inhalers/ solutions in hand so we can verify exactly what you are taking. This includes all medications from all doctors and over the counters

## 2021-09-09 ENCOUNTER — Telehealth: Payer: Self-pay | Admitting: Internal Medicine

## 2021-09-09 DIAGNOSIS — R058 Other specified cough: Secondary | ICD-10-CM | POA: Insufficient documentation

## 2021-09-09 MED ORDER — PANTOPRAZOLE SODIUM 40 MG PO TBEC
40.0000 mg | DELAYED_RELEASE_TABLET | Freq: Every day | ORAL | 2 refills | Status: DC
Start: 2021-09-09 — End: 2022-07-04

## 2021-09-09 MED ORDER — FAMOTIDINE 20 MG PO TABS: ORAL_TABLET | ORAL | 11 refills | Status: DC

## 2021-09-09 NOTE — Assessment & Plan Note (Addendum)
Onset around 2016 while on ACEi stopped in 2020  - PFT's   10/03/18 FEV1 1.02 (52 % ) ratio 0.87  p 6 % improvement from saba p ? prior to study with DLCO  11.12 (54%) corrects to 6 (136%)  for alv volume and FV curve no significant concavity with ERV  27%   - 08/12/2020  After extensive coaching inhaler device,  effectiveness =    50% from a baseline of 0 with proair device stopped up  -  08/12/2020   Walked RA  approx   500 ft  @ fast pace  stopped due to  End of study with sats 94% and min sob   - Allergy profile  08/12/20  >  Eos 0.3 /  IgE  24 - 09/08/2021  After extensive coaching inhaler device,  effectiveness =   25%  - 09/08/2021   Walked on RA x  3  lap(s) =  approx 450 @ slow pace, stopped due to end of study, min sob/"body gave out"   with lowest 02 sats 95%    Hx of "one aisle"  And gives out not reproduced in office as did 4.5 x that distance, appears to be an obesity/ conditioning issue  Each maintenance medication was reviewed in detail including emphasizing most importantly the difference between maintenance and prns and under what circumstances the prns are to be triggered using an action plan format where appropriate.  Total time for H and P, chart review, counseling, reviewing hfa device(s) and generating customized AVS unique to this office visit / same day charting > 30 min

## 2021-09-09 NOTE — Telephone Encounter (Signed)
Prescriptions sent into [place] [place] pharmacy. Left message with Maudie Mercury, [facility]).  Nothing further needed at this time.

## 2021-09-09 NOTE — Assessment & Plan Note (Signed)
Onset 2016 while on ACEi but never completely cleared  - rec gerd rx and d/c trelegy 09/24/20 > non adherent - 09/08/2021 rec max gerd rx and return for pfts in 6 weeks  Upper airway cough syndrome (previously labeled PNDS),  is so named because it's frequently impossible to sort out how much is  CR/sinusitis with freq throat clearing (which can be related to primary GERD)   vs  causing  secondary (" extra esophageal")  GERD from wide swings in gastric pressure that occur with throat clearing, often  promoting self use of mint and menthol lozenges that reduce the lower esophageal sphincter tone and exacerbate the problem further in a cyclical fashion.   These are the same pts (now being labeled as having "irritable larynx syndrome" by some cough centers) who not infrequently have a history of having failed to tolerate ace inhibitors,  dry powder inhalers or biphosphonates or report having atypical/extraesophageal reflux symptoms that don't respond to standard doses of PPI  and are easily confused as having aecopd or asthma flares by even experienced allergists/ pulmonologists (myself included).   >>> start with max gerd rx and regroup with pfts p 6 weeks for this chronic problem

## 2021-09-10 ENCOUNTER — Encounter: Payer: Self-pay | Admitting: Internal Medicine

## 2021-09-10 NOTE — Assessment & Plan Note (Addendum)
ERV disproportionately reduced on lung volumes 10/03/18   Body mass index is 40.44 kg/m.  -  trending up  Lab Results  Component Value Date   TSH 1.475 08/12/2020      Contributing to doe and risk of GERD >>>   reviewed the need and the process to achieve and maintain neg calorie balance > defer f/u primary care including intermittently monitoring thyroid status     Each maintenance medication was reviewed in detail including emphasizing most importantly the difference between maintenance and prns and under what circumstances the prns are to be triggered using an action plan format where appropriate.  Total time for H and P, chart review, counseling, reviewing hfa device(s) , directly observing portions of ambulatory 02 saturation study/ and generating customized AVS unique to this office visit / same day charting > 30 min

## 2021-09-16 ENCOUNTER — Other Ambulatory Visit: Payer: Self-pay

## 2021-09-16 DIAGNOSIS — R0789 Other chest pain: Secondary | ICD-10-CM

## 2021-09-16 DIAGNOSIS — R0600 Dyspnea, unspecified: Secondary | ICD-10-CM

## 2021-09-16 DIAGNOSIS — R0602 Shortness of breath: Secondary | ICD-10-CM

## 2021-09-16 DIAGNOSIS — R0609 Other forms of dyspnea: Secondary | ICD-10-CM

## 2021-10-29 ENCOUNTER — Other Ambulatory Visit: Payer: Self-pay

## 2021-10-29 ENCOUNTER — Ambulatory Visit (HOSPITAL_COMMUNITY)
Admission: RE | Admit: 2021-10-29 | Discharge: 2021-10-29 | Disposition: A | Discharge status: Home / Self Care | Payer: BLUE CROSS/BLUE SHIELD | Source: Ambulatory Visit | Attending: Internal Medicine | Admitting: Internal Medicine

## 2021-10-29 DIAGNOSIS — R0609 Other forms of dyspnea: Secondary | ICD-10-CM | POA: Insufficient documentation

## 2021-10-29 LAB — PULMONARY FUNCTION TEST
DL/VA % pred: 87 %
DL/VA: 3.78 ml/min/mmHg/L
DLCO unc % pred: 37 %
DLCO unc: 6.81 ml/min/mmHg
FEF 25-75 Post: 2.07 L/sec
FEF 25-75 Pre: 2.1 L/sec
FEF2575-%Change-Post: -1 %
FEF2575-%Pred-Post: 106 %
FEF2575-%Pred-Pre: 108 %
FEV1-%Change-Post: 0 %
FEV1-%Pred-Post: 69 %
FEV1-%Pred-Pre: 68 %
FEV1-Post: 1.3 L
FEV1-Pre: 1.28 L
FEV1FVC-%Change-Post: -3 %
FEV1FVC-%Pred-Pre: 113 %
FEV6-%Change-Post: 4 %
FEV6-%Pred-Post: 64 %
FEV6-%Pred-Pre: 61 %
FEV6-Post: 1.49 L
FEV6-Pre: 1.42 L
FEV6FVC-%Pred-Post: 103 %
FEV6FVC-%Pred-Pre: 103 %
FVC-%Change-Post: 4 %
FVC-%Pred-Post: 62 %
FVC-%Pred-Pre: 59 %
FVC-Post: 1.49 L
FVC-Pre: 1.42 L
Post FEV1/FVC ratio: 87 %
Post FEV6/FVC ratio: 100 %
Pre FEV1/FVC ratio: 91 %
Pre FEV6/FVC Ratio: 100 %
RV % pred: 393 %
RV: 7.07 L
TLC % pred: 187 %
TLC: 8.65 L

## 2021-10-29 MED ORDER — ALBUTEROL SULFATE (2.5 MG/3ML) 0.083% IN NEBU
2.5000 mg | INHALATION_SOLUTION | Freq: Once | RESPIRATORY_TRACT | Status: AC
  Administered 2021-10-29: 2.5 mg via RESPIRATORY_TRACT

## 2021-11-17 ENCOUNTER — Ambulatory Visit: Payer: BLUE CROSS/BLUE SHIELD | Admitting: Internal Medicine

## 2021-11-25 ENCOUNTER — Telehealth: Payer: Self-pay | Admitting: Internal Medicine

## 2021-11-25 NOTE — Telephone Encounter (Signed)
Spoke with Maudie Mercury, the pt's niece  We ordered CTA on this pt, and it was scheduled to be done in [place]  The pt lives in [place] and her niece says [place] is too far for her to travel  She heard that there was a place in [place] where pt could get scan and now wants to go there  PCC's, please help with this, thanks! Marking urgent since this is a CTA

## 2021-11-25 NOTE — Telephone Encounter (Signed)
Vallarie Mare is working on this one.

## 2021-11-26 ENCOUNTER — Ambulatory Visit (HOSPITAL_COMMUNITY): Payer: BLUE CROSS/BLUE SHIELD

## 2021-11-26 NOTE — Telephone Encounter (Signed)
[facility] called me and gave me some numbers for [facility] system the one in [place] does not do Ct's so I scheduled it at [facility] order has been faxed to them they will call the patient I have called Anita Mosley and she is aware

## 2021-11-29 ENCOUNTER — Telehealth: Payer: Self-pay | Admitting: Internal Medicine

## 2021-11-30 NOTE — Telephone Encounter (Signed)
Called and spoke to [place] with [facility] CT dept. She states the authurization for pt's CT angio has expired. Pt is scheduled for the CT on 12/24. Isaias Sakai stated she tried to extend the auth date but extensions arent possible, the Josem Kaufmann has to be redone. Isaias Sakai will be out of office on 12/14 but we can leave a VM if needed.    Will forward to PCCs to advise.

## 2021-11-30 NOTE — Telephone Encounter (Addendum)
I spoke to Anita Mosley w/ [employer] wo advised they will not extend the auth date.  They withdrew the authorization we have on file.  [employer] will not allow another authorization because it is less than 60 days since pt was last authorized for the CTA.  Faxing medical records to [phone] to go to a Nurse Reviewer &/or MW can do a peer to peer by 12/02/21 at 4 PM.   Rec'd fax confirm. Pt has been placed on MW's schedule in [place] for 12/15 @ 9 AM.  LVM to make pt aware that this appt is for MW to complete PEER TO PEER & not for the pt to come into the office.

## 2021-11-30 NOTE — Telephone Encounter (Signed)
I will look into this.

## 2021-12-02 ENCOUNTER — Ambulatory Visit: Payer: BLUE CROSS/BLUE SHIELD | Admitting: Internal Medicine

## 2021-12-02 NOTE — Progress Notes (Deleted)
Anita Mosley, female    DOB:  06/29/1961     MRN: [id number]   Brief patient profile: 58 yobf never smoker  developed cough/ wheeze around 2016  which developed while on ACEi which was stopped around  2020 and replaced with losartan and maint on trelegy until July 2021 when ran out  and self referred back to pulmonary clinic in [facility]  08/12/2020 where used to see Dr Luan Pulling stating can't tell any difference off trelegy with absence of airflow obst on prior pfts from 09/2018 with ERV 27%      History of Present Illness  08/12/2020  Pulmonary/ 1st office eval/Karalynn Cottone  [place]  Chief Complaint  Patient presents with   Follow-up    Self referral- former Dr Luan Pulling pt- needs rx refills on proair, lasix and trelegy. She has been out of these meds x 2 months and breathing has been some worse. She has some cough and wheezing- cough with clear to yellow sputum.   Dyspnea:  MMRC3 = can't walk 100 yards even at a slow pace at a flat grade s stopping due to sob   eg [employer]  Cough: mostly hs no worse since stopped trelegy / wakes up middle of night / min beige mucus Sleep: bed is flat/ 2 pillow SABA use: once every other  Other day  Prednisone 5 mg daily < one year for RA helps some  rec We will walk you today in the office  Only use your albuterol as a rescue medication  Try albuterol 15 min before an activity that you know would make you short of breath and see if it makes any difference and if makes none then don't take it after activity unless you can't catch your breath. Work on inhaler technique:   Pantoprazole (protonix) 40 mg   Take  30-60 min before first meal of the day and Pepcid (famotidine)  20 mg one @  bedtime  GERD diet      09/24/2020  f/u ov/Gustin Zobrist re:  Continues to do better off trelegy  Chief Complaint  Patient presents with   Follow-up    Breathing has improved some. She has occ cough with clear to yellow sputum and wheezing. She rarely uses her albuterol inhaler.   Dyspnea:   Not limited by breathing from desired activities   Cough: no  Sleeping    Not on bed blocks  SABA use: avg twice daily ? Why , never rechallenges but same as was the case on trelegy  02: none  Never took any gerd meds  Rec Bed blocks 6-8 inches under head board  Only use your albuterol(puffer/Ventolin) as a rescue medication  Try albuterol 15 min before an activity that you know would make you short of breath   Please schedule a follow up visit in 3 months but call sooner if needed with pfts on return    09/08/2021  f/u ov/[place] office/Hawkin Charo re: M0 / steroid dep RA/ ? Asthma component Had been using albuterol twice a week / never got pfts/ did not use on day of ov and not on gerd rx (never took it, never improved p last ov)  Chief Complaint  Patient presents with   Follow-up    Patient feels that sometimes she has a hard time swallowing and feels that sometimes her chest is "swollen" after using inhaler. SOB and wheezing has worsened since last visit.   Dyspnea:  can't do one aisle at grocery store  Cough: assoc with  sob  Sleeping: on side with wedge pillow  SABA use: not used today  02: none  Covid status: vax x 2  Rec Only use your albuterol as a rescue medication  Ok to try albuterol 15 min before an activity (on alternating days)  that you know would usually make you short of breath  Pantoprazole (protonix) 40 mg   Take  30-60 min before first meal of the day and Pepcid (famotidine)  20 mg after supper until return to office - GERD diet reviewed, bed blocks rec   Please schedule a follow up office visit in 6 weeks, call sooner if needed with all medications /inhalers/ solutions in hand   - PFT's  10/29/21 FEV1 1.30 (69 % ) ratio 0.87  p 0 % improvement from saba p 0 prior to study with DLCO  6.81 (37%) corrects to 3.78 (86%)  for alv volume and FV curve min concavity  And ERV 33% at wt 200   12/02/2021  f/u ov/[place] office/Keshanna Riso re: [place]/ ? Asthma/ RA maint on ***  No  chief complaint on file.   Dyspnea:  *** Cough: *** Sleeping: *** SABA use: *** 02: *** Covid status: *** Lung cancer screening: ***   No obvious day to day or daytime variability or assoc excess/ purulent sputum or mucus plugs or hemoptysis or cp or chest tightness, subjective wheeze or overt sinus or hb symptoms.   *** without nocturnal  or early am exacerbation  of respiratory  c/o's or need for noct saba. Also denies any obvious fluctuation of symptoms with weather or environmental changes or other aggravating or alleviating factors except as outlined above   No unusual exposure hx or h/o childhood pna/ asthma or knowledge of premature birth.  Current Allergies, Complete Past Medical History, Past Surgical History, Family History, and Social History were reviewed in [employer] record.  ROS  The following are not active complaints unless bolded Hoarseness, sore throat, dysphagia, dental problems, itching, sneezing,  nasal congestion or discharge of excess mucus or purulent secretions, ear ache,   fever, chills, sweats, unintended wt loss or wt gain, classically pleuritic or exertional cp,  orthopnea pnd or arm/hand swelling  or leg swelling, presyncope, palpitations, abdominal pain, anorexia, nausea, vomiting, diarrhea  or change in bowel habits or change in bladder habits, change in stools or change in urine, dysuria, hematuria,  rash, arthralgias, visual complaints, headache, numbness, weakness or ataxia or problems with walking or coordination,  change in mood or  memory.        No outpatient medications have been marked as taking for the 12/02/21 encounter (Appointment) with Tanda Rockers, MD.                              Past Medical History:  Diagnosis Date   Anemia    Fibroids    Hypertension       Objective:    Wts  12/02/2021      ***   09/08/2021      221   09/24/20 220 lb (99.8 kg)  08/12/20 224 lb (101.6 kg)  11/01/13 194 lb  (88 kg)     Vital signs reviewed  12/02/2021  - Note at rest 02 sats  ***% on ***   General appearance:    ***            Assessment

## 2021-12-02 NOTE — Telephone Encounter (Addendum)
Gave approval info to [employer] & notified pt.  Nothing further needed at this time.

## 2021-12-14 ENCOUNTER — Ambulatory Visit (HOSPITAL_COMMUNITY): Payer: BLUE CROSS/BLUE SHIELD

## 2021-12-27 ENCOUNTER — Telehealth: Payer: Self-pay | Admitting: Internal Medicine

## 2021-12-27 NOTE — Telephone Encounter (Signed)
I will call her after the ov because I have to review the whole case and eval pt and prefer to do all on same day

## 2021-12-27 NOTE — Telephone Encounter (Signed)
Will call and let pt's niece know.   Patient is unable to come to appt on Wed 12/29/2021.

## 2021-12-27 NOTE — Telephone Encounter (Signed)
Patients niece has questions about findings on Cta and wants to know if Dr. Melvyn Novas can call her and explain some of the findings from report. She is okay with bringing her aunt into the office for an appt. But will not be able to come on Wednesday and would like clarification on what findings are in CT. She states she has a few questions for Dr. Melvyn Novas   Dr. Melvyn Novas please advise.  Niece Joelene Millin can be reached at [phone]

## 2021-12-27 NOTE — Telephone Encounter (Signed)
Yes that is correct. 

## 2021-12-27 NOTE — Telephone Encounter (Signed)
Pt's niece Maudie Mercury is having a conflict with appt for 9/16 with her job.  Requesting phone visit but let her know we are only doing mychart visits and pt would need to be present.  Please advise.   New office rule is no telephone visits but will check with Dr. Melvyn Novas to confirm for pt.   Dr. Melvyn Novas please advise

## 2021-12-28 NOTE — Telephone Encounter (Signed)
Discussed verbally with Dr. Melvyn Novas this afternoon.   Patient is cancelling appt for Wed. 12/29/21 due to transportation issues. Dr. Melvyn Novas stated he could still call the patients niece Joelene Millin tomorrow.   Called and notified Joelene Millin (ok per DPR). Will cancel appt and route to Dr. Melvyn Novas again as a reminder to call pts niece tomorrow when he has time.   [phone]

## 2021-12-29 ENCOUNTER — Ambulatory Visit: Payer: BLUE CROSS/BLUE SHIELD | Admitting: Internal Medicine

## 2021-12-29 NOTE — Telephone Encounter (Signed)
Pt has no specific, no cough no cp no doe but extremely sedentary   CT 12/24 /22 is inconclusive but they probably date back years related to pleuroparenchymal changes from RA  Rec CT s constrast due 1st of April 2023 to compare to priors and placed in reminder

## 2021-12-29 NOTE — Progress Notes (Deleted)
Anita Mosley, female    DOB:  1961-04-14     MRN: [id number]   Brief patient profile: 71 yobf never smoker  developed cough/ wheeze around 2016  which developed while on ACEi which was stopped around  2020 and replaced with losartan and maint on trelegy until July 2021 when ran out  and self referred back to pulmonary clinic in [facility]  08/12/2020 where used to see Dr Anita Mosley stating can't tell any difference off trelegy with absence of airflow obst on prior pfts from 09/2018 with ERV 27%      History of Present Illness  08/12/2020  Pulmonary/ 1st [place] eval/Anita Mosley  [place]  Chief Complaint  Patient presents with   Follow-up    Self referral- former Dr Anita Mosley pt- needs rx refills on proair, lasix and trelegy. She has been out of these meds x 2 months and breathing has been some worse. She has some cough and wheezing- cough with clear to yellow sputum.   Dyspnea:  MMRC3 = can't walk 100 yards even at a slow pace at a flat grade s stopping due to sob   eg [employer]  Cough: mostly hs no worse since stopped trelegy / wakes up middle of night / min beige mucus Sleep: bed is flat/ 2 pillow SABA use: once every other  Other day  Prednisone 5 mg daily < one year for RA helps some  rec We will walk you today in the [place]  Only use your albuterol as a rescue medication  Try albuterol 15 min before an activity that you know would make you short of breath and see if it makes any difference and if makes none then don't take it after activity unless you can't catch your breath. Work on inhaler technique:   Pantoprazole (protonix) 40 mg   Take  30-60 min before first meal of the day and Pepcid (famotidine)  20 mg one @  bedtime  GERD diet      09/24/2020  f/u ov/Anita Mosley re:  Continues to do better off trelegy  Chief Complaint  Patient presents with   Follow-up    Breathing has improved some. She has occ cough with clear to yellow sputum and wheezing. She rarely uses her albuterol inhaler.   Dyspnea:   Not limited by breathing from desired activities   Cough: no  Sleeping    Not on bed blocks  SABA use: avg twice daily ? Why , never rechallenges but same as was the case on trelegy  02: none  Never took any gerd meds  Rec Bed blocks 6-8 inches under head board  Only use your albuterol(puffer/Ventolin) as a rescue medication  Try albuterol 15 min before an activity that you know would make you short of breath   Please schedule a follow up visit in 3 months but call sooner if needed with pfts on return    09/08/2021  f/u ov/Anita Mosley re: M0 / steroid dep RA/ ? Asthma component Had been using albuterol twice a week / never got pfts/ did not use on day of ov and not on gerd rx (never took it, never improved p last ov)  Chief Complaint  Patient presents with   Follow-up    Patient feels that sometimes she has a hard time swallowing and feels that sometimes her chest is "swollen" after using inhaler. SOB and wheezing has worsened since last visit.   Dyspnea:  can't do one aisle at grocery store  Cough: assoc with  sob  Sleeping: on side with wedge pillow  SABA use: not used today  02: none  Covid status: vax x 2  Rec Only use your albuterol as a rescue medication  Ok to try albuterol 15 min before an activity (on alternating days)  that you know would usually make you short of breath  Pantoprazole (protonix) 40 mg   Take  30-60 min before first meal of the day and Pepcid (famotidine)  20 mg after supper until return to [place] - GERD diet reviewed, bed blocks rec   Please schedule a follow up [place] visit in 6 weeks, call sooner if needed with all medications /inhalers/ solutions in hand   - PFT's  10/29/21 FEV1 1.30 (69 % ) ratio 0.87  p 0 % improvement from saba p 0 prior to study with DLCO  6.81 (37%) corrects to 3.78 (86%)  for alv volume and FV curve min concavity  And ERV 33% at wt 200   12/29/2021  f/u ov/[place]/Anita Mosley re: [place]/ ? Asthma/ RA maint on ***  No  chief complaint on file.   Dyspnea:  *** Cough: *** Sleeping: *** SABA use: *** 02: *** Covid status: *** Lung cancer screening: ***   No obvious day to day or daytime variability or assoc excess/ purulent sputum or mucus plugs or hemoptysis or cp or chest tightness, subjective wheeze or overt sinus or hb symptoms.   *** without nocturnal  or early am exacerbation  of respiratory  c/o's or need for noct saba. Also denies any obvious fluctuation of symptoms with weather or environmental changes or other aggravating or alleviating factors except as outlined above   No unusual exposure hx or h/o childhood pna/ asthma or knowledge of premature birth.  Current Allergies, Complete Past Medical History, Past Surgical History, Family History, and Social History were reviewed in [employer] record.  ROS  The following are not active complaints unless bolded Hoarseness, sore throat, dysphagia, dental problems, itching, sneezing,  nasal congestion or discharge of excess mucus or purulent secretions, ear ache,   fever, chills, sweats, unintended wt loss or wt gain, classically pleuritic or exertional cp,  orthopnea pnd or arm/hand swelling  or leg swelling, presyncope, palpitations, abdominal pain, anorexia, nausea, vomiting, diarrhea  or change in bowel habits or change in bladder habits, change in stools or change in urine, dysuria, hematuria,  rash, arthralgias, visual complaints, headache, numbness, weakness or ataxia or problems with walking or coordination,  change in mood or  memory.        No outpatient medications have been marked as taking for the 12/29/21 encounter (Appointment) with Anita Rockers, MD.                              Past Medical History:  Diagnosis Date   Anemia    Fibroids    Hypertension

## 2022-03-09 ENCOUNTER — Telehealth: Payer: Self-pay

## 2022-03-09 DIAGNOSIS — R0609 Other forms of dyspnea: Secondary | ICD-10-CM

## 2022-03-09 NOTE — Telephone Encounter (Signed)
-----   Message from Tanda Rockers, MD sent at 12/29/2021  5:10 PM EST ----- ?Ct s contrast due  dx doe / ? Lung nodule around 1st April  ? ?

## 2022-03-09 NOTE — Telephone Encounter (Signed)
Order placed.  ?Left detailed message on [employer] cell phone (ok per dpr) letting her know an order was placed for a chest CT to follow up on lung nodule and DOE and that someone will be reaching out to her to get ct scheduled. Advised her to call back for any questions or concerns.  ?Nothing further needed.  ? ?

## 2022-04-14 ENCOUNTER — Other Ambulatory Visit (HOSPITAL_COMMUNITY): Payer: Self-pay

## 2022-04-15 ENCOUNTER — Ambulatory Visit (HOSPITAL_COMMUNITY)
Admission: RE | Admit: 2022-04-15 | Discharge: 2022-04-15 | Disposition: A | Discharge status: Home / Self Care | Payer: Self-pay | Source: Ambulatory Visit | Attending: Internal Medicine | Admitting: Internal Medicine

## 2022-04-15 DIAGNOSIS — R0609 Other forms of dyspnea: Secondary | ICD-10-CM | POA: Insufficient documentation

## 2022-06-29 IMAGING — DX DG CHEST 2V
2 series · 2 of 2 positions shown · non-contrast
Comparison: 03/11/2019 and CT chest 12/04/2019.

CLINICAL DATA: Shortness of breath, asthma.

EXAM:
CHEST - 2 VIEW

[chest pa]
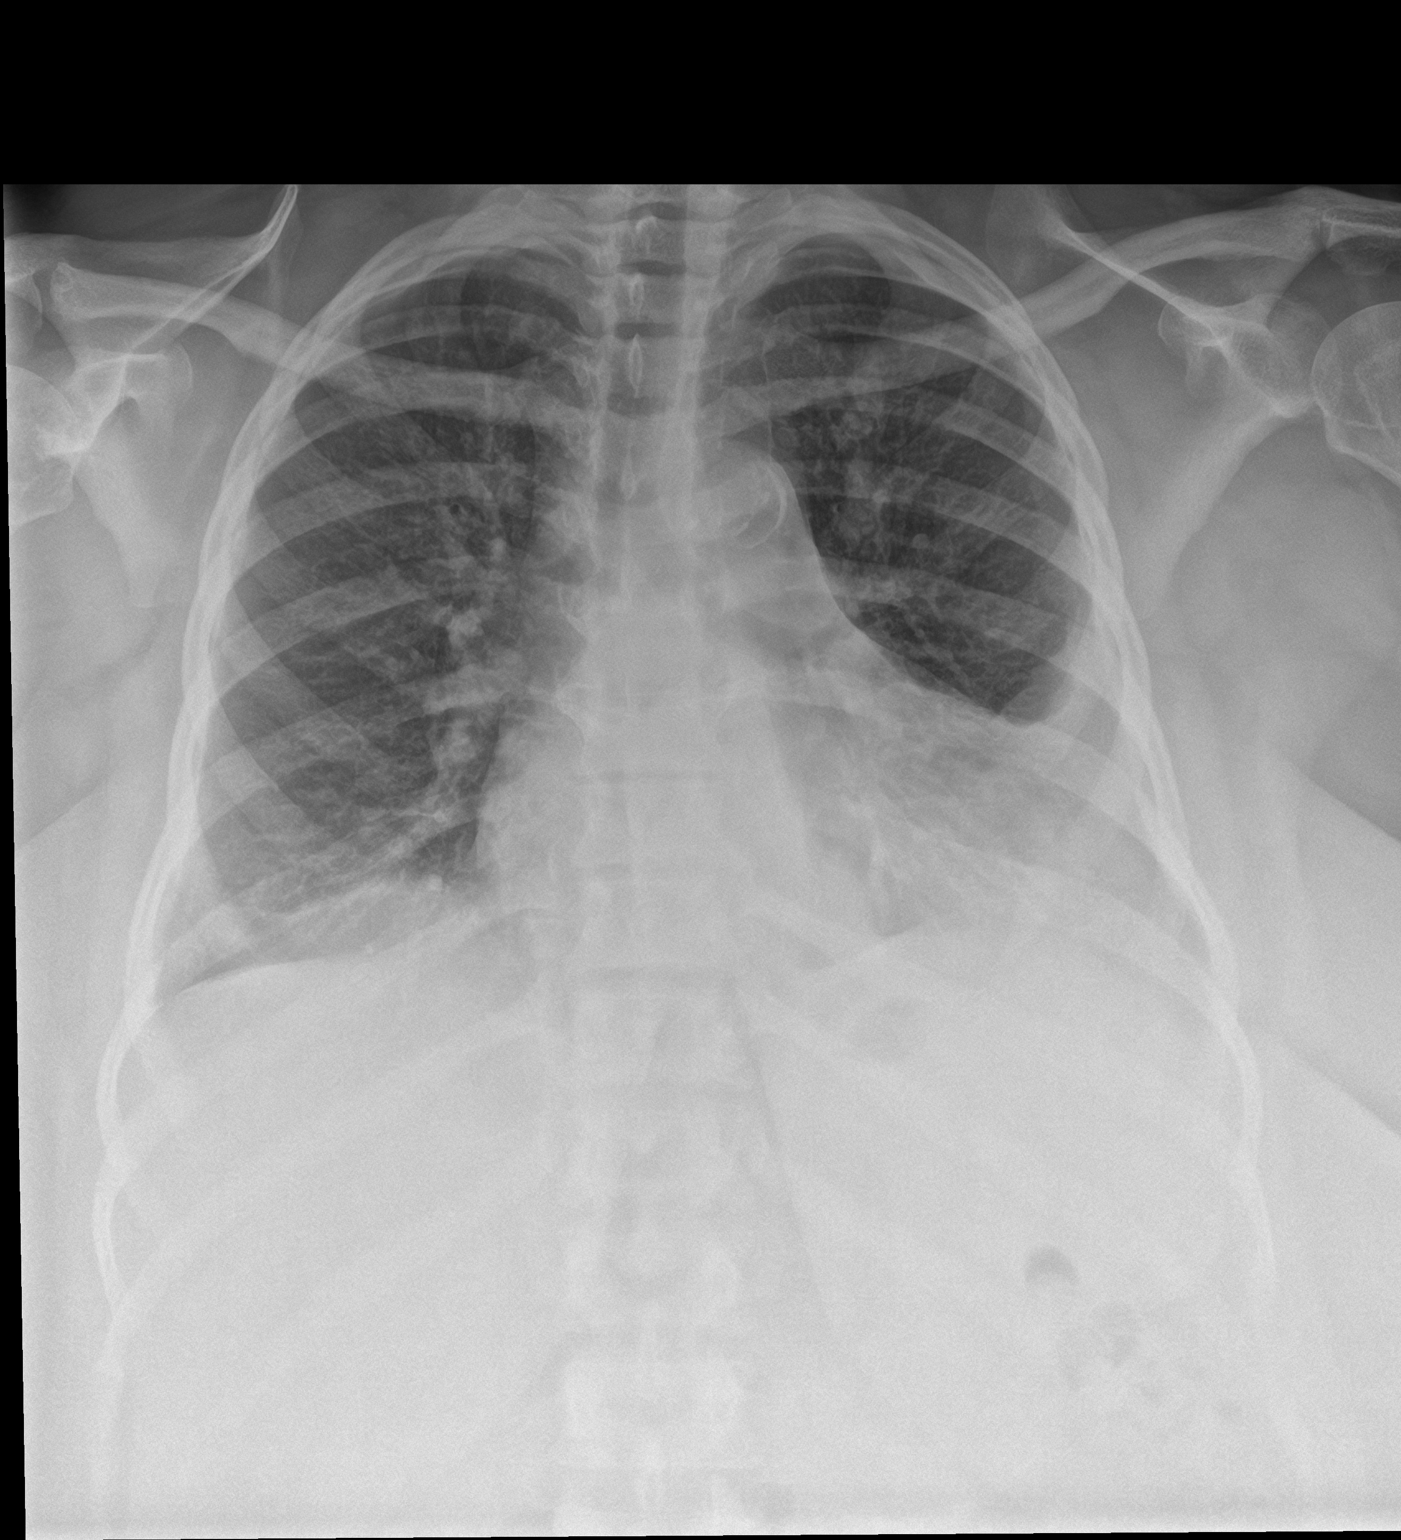

[chest lat]
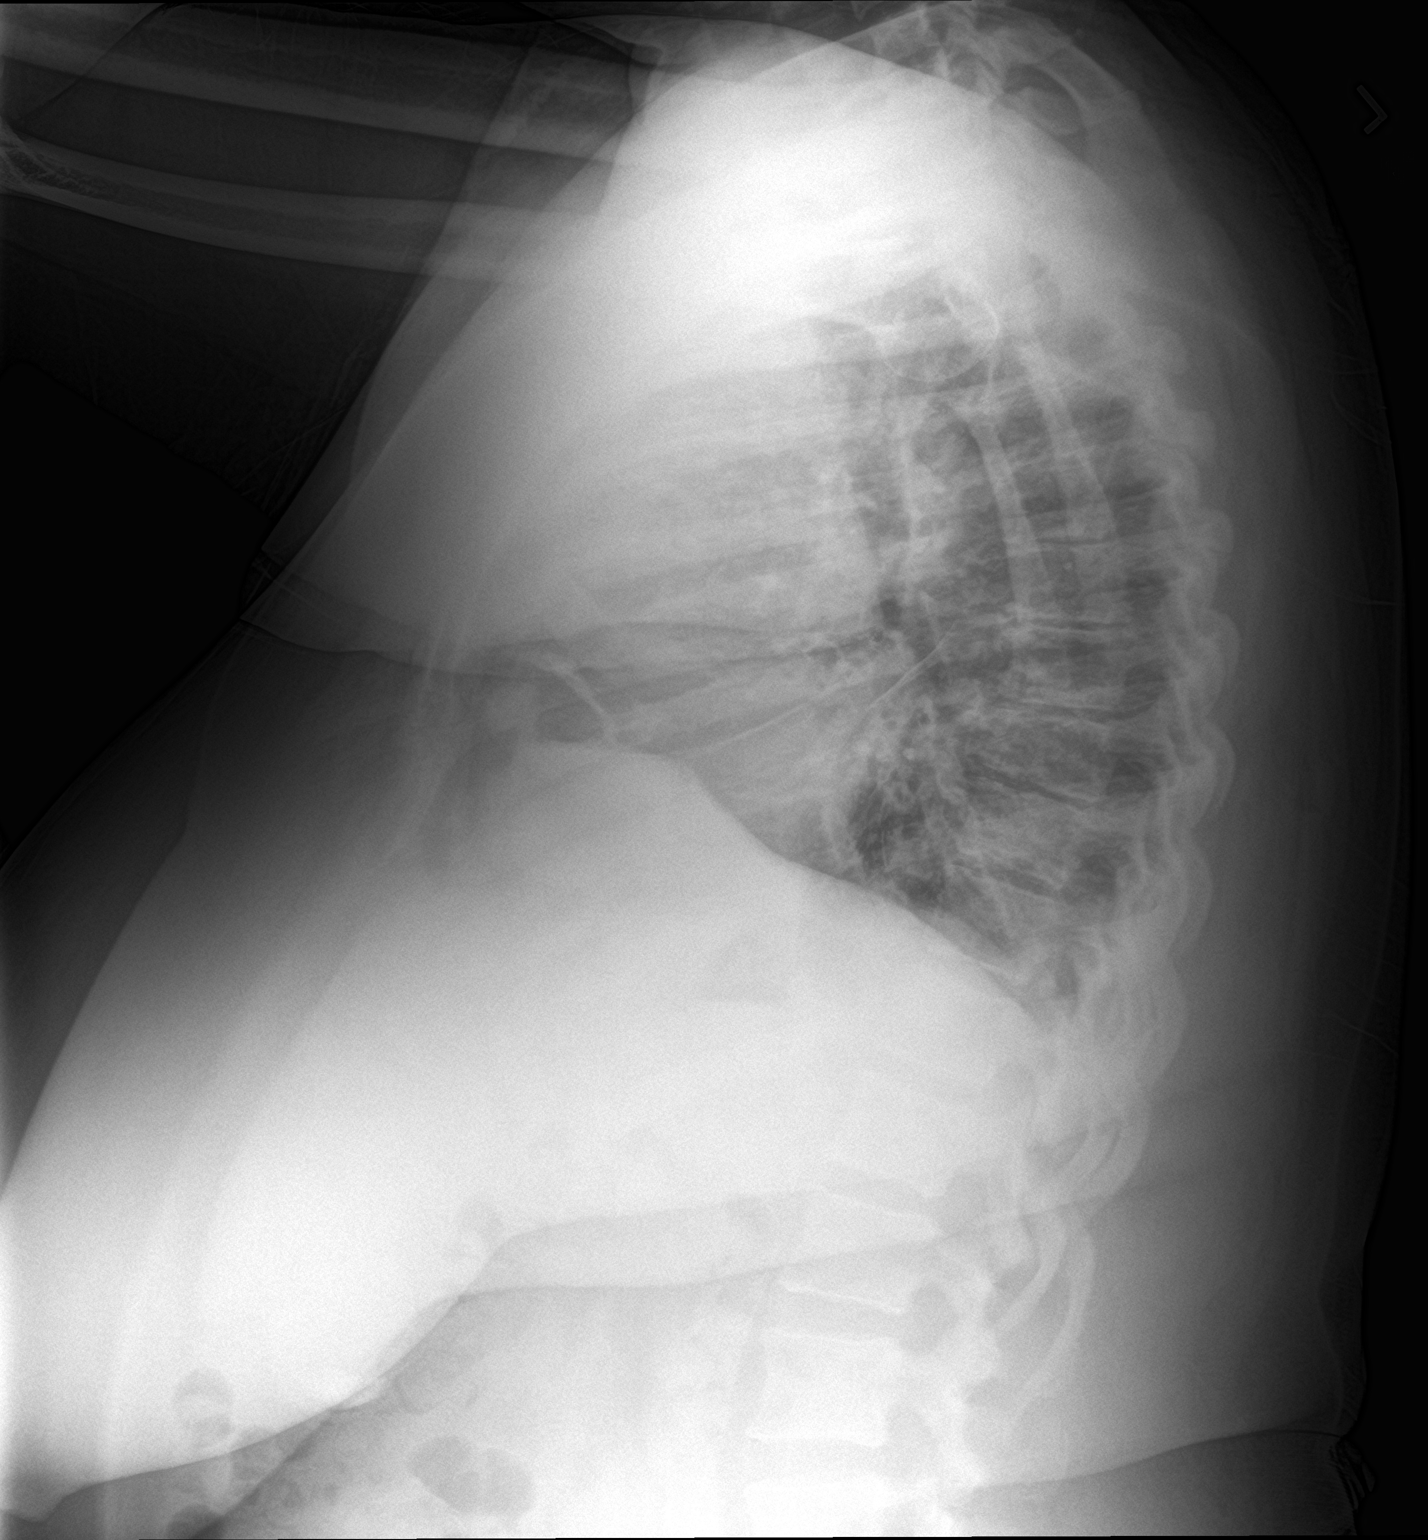

[2 of 2 positions shown; findings below may reference images not displayed]

FINDINGS: Trachea is midline. Heart is enlarged. Thoracic aorta is calcified.
Pleuroparenchymal opacity in the left costophrenic angle has
increased somewhat compared to comparison exams. There may be mild
volume loss at the base of the right hemithorax with pleural
thickening in the right costophrenic angle.
IMPRESSION: 1. Slight increase in pleuroparenchymal opacification in the left
costophrenic angle may be due to scarring. Difficult to exclude
increasing loculated pleural fluid. CT chest without contrast could
be performed in further evaluation, as clinically indicated.
2. Mild scarring at the base of the right hemithorax.
3.  Aortic atherosclerosis ([id number]).

## 2022-07-04 ENCOUNTER — Other Ambulatory Visit: Payer: Self-pay

## 2022-07-04 MED ORDER — PANTOPRAZOLE SODIUM 40 MG PO TBEC: 40.0000 mg | DELAYED_RELEASE_TABLET | Freq: Every day | ORAL | 2 refills | Status: DC

## 2022-07-04 NOTE — Telephone Encounter (Signed)
Received refill request fax from [facility] for Rx Number: [id number] Pantoprazole SOD DR 40 MG.  Refill sent.

## 2022-08-03 ENCOUNTER — Ambulatory Visit: Payer: Self-pay | Admitting: Internal Medicine

## 2022-08-03 NOTE — Progress Notes (Deleted)
Anita Mosley, female    DOB:  03/19/1961     MRN: [id number]   Brief patient profile: 31 yobf never smoker  developed cough/ wheeze around 2016  which developed while on ACEi which was stopped around  2020 and replaced with losartan and maint on trelegy until July 2021 when ran out  and self referred back to pulmonary clinic in [facility]  08/12/2020 where used to see Dr Luan Pulling stating can't tell any difference off trelegy with absence of airflow obst on prior pfts from 09/2018 with ERV 27%      History of Present Illness  08/12/2020  Pulmonary/ 1st [place] eval/Anita Mosley  [place]  Chief Complaint  Patient presents with   Follow-up    Self referral- former Dr Luan Pulling pt- needs rx refills on proair, lasix and trelegy. She has been out of these meds x 2 months and breathing has been some worse. She has some cough and wheezing- cough with clear to yellow sputum.   Dyspnea:  MMRC3 = can't walk 100 yards even at a slow pace at a flat grade s stopping due to sob   eg [employer]  Cough: mostly hs no worse since stopped trelegy / wakes up middle of night / min beige mucus Sleep: bed is flat/ 2 pillow SABA use: once every other  Other day  Prednisone 5 mg daily < one year for RA helps some  rec We will walk you today in the [place]  Only use your albuterol as a rescue medication  Try albuterol 15 min before an activity that you know would make you short of breath and see if it makes any difference and if makes none then don't take it after activity unless you can't catch your breath. Work on inhaler technique:   Pantoprazole (protonix) 40 mg   Take  30-60 min before first meal of the day and Pepcid (famotidine)  20 mg one @  bedtime  GERD diet      09/24/2020  f/u ov/Anita Mosley re:  Continues to do better off trelegy  Chief Complaint  Patient presents with   Follow-up    Breathing has improved some. She has occ cough with clear to yellow sputum and wheezing. She rarely uses her albuterol inhaler.   Dyspnea:   Not limited by breathing from desired activities   Cough: no  Sleeping    Not on bed blocks  SABA use: avg twice daily ? Why , never rechallenges but same as was the case on trelegy  02: none  Never took any gerd meds  Rec Bed blocks 6-8 inches under head board  Only use your albuterol(puffer/Ventolin) as a rescue medication  Try albuterol 15 min before an activity that you know would make you short of breath   Please schedule a follow up visit in 3 months but call sooner if needed with pfts on return    09/08/2021  f/u ov/Anita Mosley/Anita Mosley re: M0 / steroid dep RA/ ? Asthma component Had been using albuterol twice a week / never got pfts/ did not use on day of ov and not on gerd rx (never took it, never improved p last ov)  Chief Complaint  Patient presents with   Follow-up    Patient feels that sometimes she has a hard time swallowing and feels that sometimes her chest is "swollen" after using inhaler. SOB and wheezing has worsened since last visit.   Dyspnea:  can't do one aisle at grocery store  Cough: assoc with  sob  Sleeping: on side with wedge pillow  SABA use: not used today  02: none  Covid status: vax x 2   Rec Only use your albuterol as a rescue medication  Ok to try albuterol 15 min before an activity (on alternating days)  that you know would usually make you short of breath Pantoprazole (protonix) 40 mg   Take  30-60 min before first meal of the day and Pepcid (famotidine)  20 mg after supper until return to offic GERD  diet   Please remember to go to the  x-ray department  @  [facility] for your tests - we will call you with the results when they are available     Schedule PFTs as soon as possible  Please schedule a follow up [place] visit in 6 weeks, call sooner if needed with all medications /inhalers/ solutions in hand     08/03/2022  f/u ov/[place]/Anita Mosley re: *** maint on ***  No chief complaint on file.   Dyspnea:  *** Cough:  *** Sleeping: *** SABA use: *** 02: *** Covid status: *** Lung cancer screening: ***   No obvious day to day or daytime variability or assoc excess/ purulent sputum or mucus plugs or hemoptysis or cp or chest tightness, subjective wheeze or overt sinus or hb symptoms.   *** without nocturnal  or early am exacerbation  of respiratory  c/o's or need for noct saba. Also denies any obvious fluctuation of symptoms with weather or environmental changes or other aggravating or alleviating factors except as outlined above   No unusual exposure hx or h/o childhood pna/ asthma or knowledge of premature birth.  Current Allergies, Complete Past Medical History, Past Surgical History, Family History, and Social History were reviewed in [employer] record.  ROS  The following are not active complaints unless bolded Hoarseness, sore throat, dysphagia, dental problems, itching, sneezing,  nasal congestion or discharge of excess mucus or purulent secretions, ear ache,   fever, chills, sweats, unintended wt loss or wt gain, classically pleuritic or exertional cp,  orthopnea pnd or arm/hand swelling  or leg swelling, presyncope, palpitations, abdominal pain, anorexia, nausea, vomiting, diarrhea  or change in bowel habits or change in bladder habits, change in stools or change in urine, dysuria, hematuria,  rash, arthralgias, visual complaints, headache, numbness, weakness or ataxia or problems with walking or coordination,  change in mood or  memory.        No outpatient medications have been marked as taking for the 08/03/22 encounter (Appointment) with Tanda Rockers, MD.                        Past Medical History:  Diagnosis Date   Anemia    Fibroids    Hypertension       Objective:    Wts  08/03/2022        ***   09/08/2021      221   09/24/20 220 lb (99.8 kg)  08/12/20 224 lb (101.6 kg)  11/01/13 194 lb (88 kg)     Vital signs reviewed  08/03/2022  - Note at rest  02 sats  ***% on ***   General appearance:    ***                      Assessment

## 2023-06-05 NOTE — Progress Notes (Unsigned)
Anita Mosley, female    DOB:  [birth year]     MRN: [id number]   Brief patient profile: 61 yobf never smoker with RA  developed cough/ wheeze around 2016  which developed while on ACEi which was stopped around  2020 and replaced with losartan and maint on trelegy until July 2021 when ran out  and self referred back to pulmonary clinic in [facility]  08/12/2020 where used to see Dr Juanetta Gosling stating can't tell any difference off trelegy with absence of airflow obst on prior pfts from 09/2018 with ERV 27%      History of Present Illness  08/12/2020  Pulmonary/ 1st office eval/Treazure Nery  [place]  Chief Complaint  Patient presents with   Follow-up    Self referral- former Dr Juanetta Gosling pt- needs rx refills on proair, lasix and trelegy. She has been out of these meds x 2 months and breathing has been some worse. She has some cough and wheezing- cough with clear to yellow sputum.   Dyspnea:  MMRC3 = can't walk 100 yards even at a slow pace at a flat grade s stopping due to sob   eg [employer]  Cough: mostly hs no worse since stopped trelegy / wakes up middle of night / min beige mucus Sleep: bed is flat/ 2 pillow SABA use: once every other  Other day  Prednisone 5 mg daily < one year for RA helps some  rec We will walk you today in the office  Only use your albuterol as a rescue medication  Try albuterol 15 min before an activity that you know would make you short of breath and see if it makes any difference and if makes none then don't take it after activity unless you can't catch your breath. Work on inhaler technique:   Pantoprazole (protonix) 40 mg   Take  30-60 min before first meal of the day and Pepcid (famotidine)  20 mg one @  bedtime  GERD diet       09/08/2021  f/u ov/[place] office/[place]ondo Nanda re: M0 / steroid dep RA/ ? Asthma component- better off trelegy  Had been using albuterol twice a week / never got pfts/ did not use on day of ov and not on gerd rx (never took it, never improved p last ov)   Chief Complaint  Patient presents with   Follow-up    Patient feels that sometimes she has a hard time swallowing and feels that sometimes her chest is "swollen" after using inhaler. SOB and wheezing has worsened since last visit.   Dyspnea:  can't do one aisle at grocery store  Cough: assoc with sob  Sleeping: on side with wedge pillow  SABA use: not used today  02: none  Covid status: vax x 2  Rec Only use your albuterol as a rescue medication  Ok to try albuterol 15 min before an activity (on alternating days)  that you know would usually make you short of breath  Pantoprazole (protonix) 40 mg   Take  30-60 min before first meal of the day and Pepcid (famotidine)  20 mg after supper until return to office GERD diet reviewed, bed blocks rec .    Please schedule a follow up office visit in 6 weeks, call sooner if needed with all medications /inhalers/ solutions in hand     06/06/2023  f/u ov/[place] office/Aleana Fifita re: RA/AB maint on nothing and  did not  bring meds  Chief Complaint  Patient presents with   Follow-up  Dyspnea:  one aisle at grocery  Cough: none  Sleeping: wedge pillow does ok / wheeze when flat/ventolin  inhaler helped some SABA use: none x months /years  02: none  Positional R shoulder pain    No obvious day to day or daytime variability or assoc excess/ purulent sputum or mucus plugs or hemoptysis or cp or chest tightness, subjective wheeze or overt sinus or hb symptoms.     Also denies any obvious fluctuation of symptoms with weather or environmental changes or other aggravating or alleviating factors except as outlined above   No unusual exposure hx or h/o childhood pna/ asthma or knowledge of premature birth.  Current Allergies, Complete Past Medical History, Past Surgical History, Family History, and Social History were reviewed in [employer] record.  ROS  The following are not active complaints unless bolded Hoarseness, sore  throat, dysphagia, dental problems, itching, sneezing,  nasal congestion or discharge of excess mucus or purulent secretions, ear ache,   fever, chills, sweats, unintended wt loss or wt gain, classically pleuritic or exertional cp,  orthopnea pnd or arm/hand swelling  or leg swelling, presyncope, palpitations, abdominal pain, anorexia, nausea, vomiting, diarrhea  or change in bowel habits or change in bladder habits, change in stools or change in urine, dysuria, hematuria,  rash, arthralgias, visual complaints, headache, numbness, weakness or ataxia or problems with walking or coordination,  change in mood or  memory.        Current Meds  Medication Sig   albuterol (PROAIR HFA) 108 (90 Base) MCG/ACT inhaler Inhale 2 puffs into the lungs every 4 (four) hours as needed for wheezing or shortness of breath.   atenolol (TENORMIN) 25 MG tablet Take 25 mg by mouth daily.    famotidine (PEPCID) 20 MG tablet One after supper   ferrous sulfate 325 (65 FE) MG tablet Take 1 tablet (325 mg total) by mouth 2 (two) times daily.   furosemide (LASIX) 40 MG tablet TAKE 1 TABLET(40 MG) BY MOUTH DAILY   LOSARTAN POTASSIUM PO Take 1 tablet by mouth daily.   Multiple Vitamins-Calcium (ONE-A-DAY WOMENS PO) Take 1 tablet by mouth daily as needed (as supplement).   pantoprazole (PROTONIX) 40 MG tablet Take 1 tablet (40 mg total) by mouth daily. Take 30-60 min before first meal of the day          Past Medical History:  Diagnosis Date   Anemia    Fibroids    Hypertension       Objective:    Wts  06/06/2023      172   09/08/2021      221   09/24/20 220 lb (99.8 kg)  08/12/20 224 lb (101.6 kg)  11/01/13 194 lb (88 kg)     Vital signs reviewed  06/06/2023  - Note at rest 02 sats  98% on  RA   General appearance:    Amb pleasant bf nad   HEENT : Oropharynx  clear     Nasal turbinates nl    NECK :  without  apparent JVD/ palpable Nodes/TM    LUNGS: no acc muscle use,  Nl contour chest which is clear to A  and P bilaterally without cough on insp or exp maneuvers   CV:  RRR  no s3 or murmur or increase in P2, and no edema   ABD:  soft and nontender with nl inspiratory excursion in the supine position. No bruits or organomegaly appreciated   MS:  Nl gait/  ext warm with brace on R knee - no other  obvious joint restrictions  calf tenderness, cyanosis or clubbing    SKIN: warm and dry without lesions    NEURO:  alert, approp, nl sensorium with  no motor or cerebellar deficits apparent.       CXR PA and Lateral:   06/06/2023 :    I personally reviewed images and impression is as follows:     Improved aeration vs prior studies / effusions smaller          Assessment

## 2023-06-06 ENCOUNTER — Encounter: Payer: Self-pay | Admitting: Internal Medicine

## 2023-06-06 ENCOUNTER — Ambulatory Visit (HOSPITAL_COMMUNITY)
Admission: RE | Admit: 2023-06-06 | Discharge: 2023-06-06 | Disposition: A | Discharge status: Home / Self Care | Payer: 59 | Source: Ambulatory Visit | Attending: Internal Medicine | Admitting: Internal Medicine

## 2023-06-06 ENCOUNTER — Ambulatory Visit (INDEPENDENT_AMBULATORY_CARE_PROVIDER_SITE_OTHER): Payer: Self-pay | Admitting: Internal Medicine

## 2023-06-06 VITALS — BP 123/58 | HR 77 | Ht 61.5 in | Wt 172.0 lb

## 2023-06-06 DIAGNOSIS — R0609 Other forms of dyspnea: Secondary | ICD-10-CM

## 2023-06-06 MED ORDER — ALBUTEROL SULFATE HFA 108 (90 BASE) MCG/ACT IN AERS: 2.0000 | INHALATION_SPRAY | RESPIRATORY_TRACT | 11 refills | Status: DC | PRN

## 2023-06-06 NOTE — Patient Instructions (Signed)
GERD (REFLUX)  is an extremely common cause of respiratory symptoms just like yours , many times with no obvious heartburn at all.    It can be treated with medication, but also with lifestyle changes including elevation of the head of your bed (ideally with 6 -8inch blocks under the headboard of your bed),  Smoking cessation, avoidance of late meals, excessive alcohol, and avoid fatty foods, chocolate, peppermint, colas, red wine, and acidic juices such as orange juice.  NO MINT OR MENTHOL PRODUCTS SO NO COUGH DROPS  USE SUGARLESS CANDY INSTEAD (Jolley ranchers or Stover's or Life Savers) or even ice chips will also do - the key is to swallow to prevent all throat clearing. NO OIL BASED VITAMINS - use powdered substitutes.  Avoid fish oil when coughing.  Please remember to go to the  x-ray department  @  [facility] for your tests - we will call you with the results when they are available     AirSupra supply through [place] [facility] [place]  > we will start a  request and in meantime use proair (albuterol) by itself up to every 4 hours as needed   Only use your albuterol as a rescue medication to be used if you can't catch your breath by resting or doing a relaxed purse lip breathing pattern.  - The less you use it, the better it will work when you need it. - Ok to use up to 2 puffs  every 4 hours if you must but call for immediate appointment if use goes up over your usual need - Don't leave home without it !!  (think of it like the spare tire for your car)   Also  Ok to try albuterol x 2 puffs x 15 min before an activity (on alternating days)  that you know would usually make you short of breath and see if it makes any difference and if makes none then don't take albuterol after activity unless you can't catch your breath as this means it's the resting that helps, not the albuterol.  Please schedule a follow up visit in 3 months but call sooner if needed

## 2023-06-06 NOTE — Assessment & Plan Note (Addendum)
Onset around 2016 while on ACEi stopped in 2020  - PFT's   10/03/18 FEV1 1.02 (52 % ) ratio 0.87  p 6 % improvement from saba p ? prior to study with DLCO  11.12 (54%) corrects to 6 (136%)  for alv volume and FV curve no significant concavity with ERV  27%   - 08/12/2020  After extensive coaching inhaler device,  effectiveness =    50% from a baseline of 0 with proair device stopped up  -  08/12/2020   Walked RA  approx   500 ft  @ fast pace  stopped due to  End of study with sats 94% and min sob   - Allergy profile  08/12/20  >  Eos 0.3 /  IgE  24 - 09/08/2021  After extensive coaching inhaler device,  effectiveness =   25%  - 09/08/2021   Walked on RA x  3  lap(s) =  approx 450 @ slow pace, stopped due to end of study, min sob/"body gave out"   with lowest 02 sats 95%  - PFT's  10/29/21 FEV1 1.30 (69 % ) ratio 0.87  p 0 % improvement from saba p 0 prior to study with DLCO  6.81 (37%) corrects to 3.78 (86%)  for alv volume and FV curve min concavity  And ERV 33% at wt 200 -  I personally reviewed images and agree with radiology impression as follows:   Chest CTa 12/11/21  No emboli.  Masslike consolidation inferior lingula may reflect pneumonia or structural lesion including neoplasm. Prior studies are not available. Short-term follow-up in 4-6 weeks recommended to determine if this is a fixed lesion >> f/u CT s contrast:  04/15/22 1. Multiple peripheral rounded opacities are again seen in the lungs bilaterally measuring up to 2.7 cm. These have minimally decreased in size from 2020 - 06/06/2023  After extensive coaching inhaler device,  effectiveness =  50% from baseline near 0  > try Air Supra via [place] [facility] [place] - cxr 06/06/2023 improved aeration both bases - 06/06/2023   Walked on RA  x approx 75-100  ft  @ mod pace, stopped due to knee in brace  with lowest 02 sats 95%     No evidence of progressive lung dz based on RA involvement and probably can just rx airways with Air Supra samples form [place] for now as  has no funds  F/u can be in 3 m, call sooner prn         Each maintenance medication was reviewed in detail including emphasizing most importantly the difference between maintenance and prns and under what circumstances the prns are to be triggered using an action plan format where appropriate.  Total time for H and P, chart review, counseling, reviewing hfa device(s) and generating customized AVS unique to this office visit / same day charting = 25 min with pt not seen > 1.5  y

## 2023-09-05 NOTE — Progress Notes (Deleted)
Anita Mosley, female    DOB:  1961/05/08     MRN: [id number]   Brief patient profile: 54 yobf never smoker with RA  developed cough/ wheeze around 2016  which developed while on ACEi which was stopped around  2020 and replaced with losartan and maint on trelegy until July 2021 when ran out  and self referred back to pulmonary clinic in [facility]  08/12/2020 where used to see Dr Anita Mosley stating can't tell any difference off trelegy with absence of airflow obst on prior pfts from 09/2018 with ERV 27%      History of Present Illness  08/12/2020  Pulmonary/ 1st office eval/Anita Mosley  [place]  Chief Complaint  Patient presents with   Follow-up    Self referral- former Dr Anita Mosley pt- needs rx refills on proair, lasix and trelegy. She has been out of these meds x 2 months and breathing has been some worse. She has some cough and wheezing- cough with clear to yellow sputum.   Dyspnea:  MMRC3 = can't walk 100 yards even at a slow pace at a flat grade s stopping due to sob   eg [employer]  Cough: mostly hs no worse since stopped trelegy / wakes up middle of night / min beige mucus Sleep: bed is flat/ 2 pillow SABA use: once every other  Other day  Prednisone 5 mg daily < one year for RA helps some  rec We will walk you today in the office  Only use your albuterol as a rescue medication  Try albuterol 15 min before an activity that you know would make you short of breath and see if it makes any difference and if makes none then don't take it after activity unless you can't catch your breath. Work on inhaler technique:   Pantoprazole (protonix) 40 mg   Take  30-60 min before first meal of the day and Pepcid (famotidine)  20 mg one @  bedtime  GERD diet       09/08/2021  f/u ov/[place] office/Anita Mosley re: M0 / steroid dep RA/ ? Asthma component- better off trelegy  Had been using albuterol twice a week / never got pfts/ did not use on day of ov and not on gerd rx (never took it, never improved p last ov)   Chief Complaint  Patient presents with   Follow-up    Patient feels that sometimes she has a hard time swallowing and feels that sometimes her chest is "swollen" after using inhaler. SOB and wheezing has worsened since last visit.   Dyspnea:  can't do one aisle at grocery store  Cough: assoc with sob  Sleeping: on side with wedge pillow  SABA use: not used today  02: none  Covid status: vax x 2  Rec Only use your albuterol as a rescue medication  Ok to try albuterol 15 min before an activity (on alternating days)  that you know would usually make you short of breath  Pantoprazole (protonix) 40 mg   Take  30-60 min before first meal of the day and Pepcid (famotidine)  20 mg after supper until return to office GERD diet reviewed, bed blocks rec .    Please schedule a follow up office visit in 6 weeks, call sooner if needed with all medications /inhalers/ solutions in hand     06/06/2023  f/u ov/[place] office/Anita Mosley re: RA/AB maint on nothing and  did not  bring meds  Chief Complaint  Patient presents with   Follow-up  Dyspnea:  one aisle at grocery  Cough: none  Sleeping: wedge pillow does ok / wheeze when flat/ventolin  inhaler helped some SABA use: none x months /years  02: none  Positional R shoulder pain Rec GERD diet reviewed, bed blocks rec  AirSupra supply through [place] [employer] [place]  > we will start a  request and in meantime use proair (albuterol) by itself up to every 4 hours as needed  albuterol as a rescue medication  Also  Ok to try albuterol x 2 puffs x 15 min before an activity (on alternating days)  that you know would usually make you short of breath     09/06/2023  3 m f/u ov/[place] office/Anita Mosley re: *** maint on ***  No chief complaint on file.   Dyspnea:  *** Cough: *** Sleeping: ***   resp cc  SABA use: *** 02: ***  Lung cancer screening: ***   No obvious day to day or daytime variability or assoc excess/ purulent sputum or mucus plugs or hemoptysis or cp or  chest tightness, subjective wheeze or overt sinus or hb symptoms.    Also denies any obvious fluctuation of symptoms with weather or environmental changes or other aggravating or alleviating factors except as outlined above   No unusual exposure hx or h/o childhood pna/ asthma or knowledge of premature birth.  Current Allergies, Complete Past Medical History, Past Surgical History, Family History, and Social History were reviewed in [employer] record.  ROS  The following are not active complaints unless bolded Hoarseness, sore throat, dysphagia, dental problems, itching, sneezing,  nasal congestion or discharge of excess mucus or purulent secretions, ear ache,   fever, chills, sweats, unintended wt loss or wt gain, classically pleuritic or exertional cp,  orthopnea pnd or arm/hand swelling  or leg swelling, presyncope, palpitations, abdominal pain, anorexia, nausea, vomiting, diarrhea  or change in bowel habits or change in bladder habits, change in stools or change in urine, dysuria, hematuria,  rash, arthralgias, visual complaints, headache, numbness, weakness or ataxia or problems with walking or coordination,  change in mood or  memory.        No outpatient medications have been marked as taking for the 09/06/23 encounter (Appointment) with Anita Cowden, MD.           Past Medical History:  Diagnosis Date   Anemia    Fibroids    Hypertension       Objective:    Wts  09/06/2023        ***  06/06/2023      172   09/08/2021      221   09/24/20 220 lb (99.8 kg)  08/12/20 224 lb (101.6 kg)  11/01/13 194 lb (88 kg)     Vital signs reviewed  09/06/2023  - Note at rest 02 sats  ***% on ***   General appearance:    ***           Assessment

## 2023-09-06 ENCOUNTER — Ambulatory Visit: Payer: 59 | Admitting: Internal Medicine

## 2023-10-02 NOTE — Progress Notes (Unsigned)
Anita Mosley, female    DOB:  1961/04/10     MRN: [id number]   Brief patient profile: 67 yobf never smoker with RA  developed cough/ wheeze around 2016  which developed while on ACEi which was stopped around  2020 and replaced with losartan and maint on trelegy until July 2021 when ran out  and self referred back to pulmonary clinic in [facility]  08/12/2020 where used to see Dr Juanetta Gosling stating can't tell any difference off trelegy with absence of airflow obst on prior pfts from 09/2018 with ERV 27%      History of Present Illness  08/12/2020  Pulmonary/ 1st office eval/Arantza Darrington  [place]  Chief Complaint  Patient presents with   Follow-up    Self referral- former Dr Juanetta Gosling pt- needs rx refills on proair, lasix and trelegy. She has been out of these meds x 2 months and breathing has been some worse. She has some cough and wheezing- cough with clear to yellow sputum.   Dyspnea:  MMRC3 = can't walk 100 yards even at a slow pace at a flat grade s stopping due to sob   eg [employer]  Cough: mostly hs no worse since stopped trelegy / wakes up middle of night / min beige mucus Sleep: bed is flat/ 2 pillow SABA use: once every other  Other day  Prednisone 5 mg daily < one year for RA helps some  rec We will walk you today in the office  Only use your albuterol as a rescue medication  Try albuterol 15 min before an activity that you know would make you short of breath and see if it makes any difference and if makes none then don't take it after activity unless you can't catch your breath. Work on inhaler technique:   Pantoprazole (protonix) 40 mg   Take  30-60 min before first meal of the day and Pepcid (famotidine)  20 mg one @  bedtime  GERD diet       09/08/2021  f/u ov/[place] office/Unknown Schleyer re: M0 / steroid dep RA/ ? Asthma component- better off trelegy  Had been using albuterol twice a week / never got pfts/ did not use on day of ov and not on gerd rx (never took it, never improved p last ov)   Chief Complaint  Patient presents with   Follow-up    Patient feels that sometimes she has a hard time swallowing and feels that sometimes her chest is "swollen" after using inhaler. SOB and wheezing has worsened since last visit.   Dyspnea:  can't do one aisle at grocery store  Cough: assoc with sob  Sleeping: on side with wedge pillow  SABA use: not used today  02: none  Covid status: vax x 2  Rec Only use your albuterol as a rescue medication  Ok to try albuterol 15 min before an activity (on alternating days)  that you know would usually make you short of breath  Pantoprazole (protonix) 40 mg   Take  30-60 min before first meal of the day and Pepcid (famotidine)  20 mg after supper until return to office GERD diet reviewed, bed blocks rec .    Please schedule a follow up office visit in 6 weeks, call sooner if needed with all medications /inhalers/ solutions in hand     06/06/2023  f/u ov/ office/Marua Qin re: RA/AB maint on nothing and  did not  bring meds  Chief Complaint  Patient presents with   Follow-up  Dyspnea:  one aisle at grocery  Cough: none  Sleeping: wedge pillow does ok / wheeze when flat/ventolin  inhaler helped some SABA use: none x months /years  02: none  Positional R shoulder pain Rec GERD diet reviewed, bed blocks rec  AirSupra supply through [facility]  > we will start a  request and in meantime use proair (albuterol) by itself up to every 4 hours as needed  albuterol as a rescue medication  Also  Ok to try albuterol x 2 puffs x 15 min before an activity (on alternating days)  that you know would usually make you short of breath     10/03/2023  3 m f/u ov/[place] office/Ky Rumple re: RA/AB maint on saba   Chief Complaint  Patient presents with   Follow-up   Dyspnea:  up to  2 aisles at [employer] and [place] parking lot  Cough: tends to be while sleeping  around  2 am about every other  night  Sleeping: level / 2 pillows SABA use: used p arrive before  being seen but hfa nearly 0% effective technique 02: no     No obvious day to day or daytime variability or assoc excess/ purulent sputum or mucus plugs or hemoptysis or cp or chest tightness, subjective wheeze or overt sinus or hb symptoms.    Also denies any obvious fluctuation of symptoms with weather or environmental changes or other aggravating or alleviating factors except as outlined above   No unusual exposure hx or h/o childhood pna/ asthma or knowledge of premature birth.  Current Allergies, Complete Past Medical History, Past Surgical History, Family History, and Social History were reviewed in [employer] record.  ROS  The following are not active complaints unless bolded Hoarseness, sore throat, dysphagia/ globus, dental problems, itching, sneezing,  nasal congestion or discharge of excess mucus or purulent secretions, ear ache,   fever, chills, sweats, unintended wt loss or wt gain, classically pleuritic or exertional cp,  orthopnea pnd or arm/hand swelling  or leg swelling, presyncope, palpitations, abdominal pain, anorexia, nausea, vomiting, diarrhea  or change in bowel habits or change in bladder habits, change in stools or change in urine, dysuria, hematuria,  rash, arthralgias, visual complaints, headache, numbness, weakness or ataxia or problems with walking or coordination,  change in mood or  memory.        Current Meds  Medication Sig   albuterol (PROAIR HFA) 108 (90 Base) MCG/ACT inhaler Inhale 2 puffs into the lungs every 4 (four) hours as needed for wheezing or shortness of breath.   atenolol (TENORMIN) 25 MG tablet Take 25 mg by mouth daily.    famotidine (PEPCID) 20 MG tablet One after supper   ferrous sulfate 325 (65 FE) MG tablet Take 1 tablet (325 mg total) by mouth 2 (two) times daily.   furosemide (LASIX) 40 MG tablet TAKE 1 TABLET(40 MG) BY MOUTH DAILY   hydrochlorothiazide (HYDRODIURIL) 25 MG tablet Take 25 mg by mouth every morning.    LOSARTAN POTASSIUM PO Take 1 tablet by mouth daily.   Multiple Vitamins-Calcium (ONE-A-DAY WOMENS PO) Take 1 tablet by mouth daily as needed (as supplement).   pantoprazole (PROTONIX) 40 MG tablet Take 1 tablet (40 mg total) by mouth daily. Take 30-60 min before first meal of the day           Past Medical History:  Diagnosis Date   Anemia    Fibroids    Hypertension  Objective:    Wts  10/03/2023    180   06/06/2023      172   09/08/2021      221   09/24/20 220 lb (99.8 kg)  08/12/20 224 lb (101.6 kg)  11/01/13 194 lb (88 kg)     Vital signs reviewed  10/03/2023  - Note at rest 02 sats  94% on RA   General appearance:    amb bf prominent pseudowheeze   HEENT : Oropharynx  clear         NECK :  without  apparent JVD/ palpable Nodes/TM    LUNGS: no acc muscle use,  Nl contour chest which is clear to A and P bilaterally without cough on insp or exp maneuvers   CV:  RRR  no s3 or murmur or increase in P2, and no edema   ABD:  soft and nontender   MS:  Nl gait/ ext warm without deformities Or obvious joint restrictions  calf tenderness, cyanosis or clubbing    SKIN: warm and dry without lesions    NEURO:  alert, approp, nl sensorium with  no motor or cerebellar deficits apparent.     CXR PA and Lateral:   10/03/2023 :    I personally reviewed images and impression is as follows:     Chronic changes LLL no different from prior       Assessment

## 2023-10-03 ENCOUNTER — Other Ambulatory Visit: Payer: Self-pay

## 2023-10-03 ENCOUNTER — Encounter: Payer: Self-pay | Admitting: Internal Medicine

## 2023-10-03 ENCOUNTER — Ambulatory Visit: Payer: 59 | Admitting: Internal Medicine

## 2023-10-03 ENCOUNTER — Ambulatory Visit (HOSPITAL_COMMUNITY)
Admission: RE | Admit: 2023-10-03 | Discharge: 2023-10-03 | Disposition: A | Discharge status: Home / Self Care | Payer: 59 | Source: Ambulatory Visit | Attending: Internal Medicine | Admitting: Internal Medicine

## 2023-10-03 ENCOUNTER — Telehealth: Payer: Self-pay | Admitting: Internal Medicine

## 2023-10-03 VITALS — BP 108/67 | HR 88 | Ht 61.5 in | Wt 180.0 lb

## 2023-10-03 DIAGNOSIS — R0609 Other forms of dyspnea: Secondary | ICD-10-CM | POA: Insufficient documentation

## 2023-10-03 MED ORDER — PANTOPRAZOLE SODIUM 40 MG PO TBEC: 40.0000 mg | DELAYED_RELEASE_TABLET | Freq: Every day | ORAL | 2 refills | Status: AC

## 2023-10-03 MED ORDER — PREDNISONE 10 MG PO TABS: ORAL_TABLET | ORAL | 0 refills | Status: AC

## 2023-10-03 MED ORDER — FAMOTIDINE 20 MG PO TABS: ORAL_TABLET | ORAL | 11 refills | Status: AC

## 2023-10-03 MED ORDER — PREDNISONE 10 MG PO TABS: ORAL_TABLET | ORAL | 0 refills | Status: DC

## 2023-10-03 MED ORDER — PANTOPRAZOLE SODIUM 40 MG PO TBEC: 40.0000 mg | DELAYED_RELEASE_TABLET | Freq: Every day | ORAL | 2 refills | Status: DC

## 2023-10-03 NOTE — Telephone Encounter (Signed)
Patient's  niece, Keyri Salberg called and states Mrs. Berrie's medication refills were sent to the incorrect pharmacy---patient uses [facility] in [place].  Pleas remove all other pharmacies from her record and could we send the patient's refills to [facility] in [place] back # [phone]

## 2023-10-03 NOTE — Telephone Encounter (Signed)
Resend medications to [facility] and called and spoke with Cala Bradford to inform her of this.

## 2023-10-03 NOTE — Assessment & Plan Note (Signed)
Onset around 2016 while on ACEi stopped in 2020  - PFT's   10/03/18 FEV1 1.02 (52 % ) ratio 0.87  p 6 % improvement from saba p ? prior to study with DLCO  11.12 (54%) corrects to 6 (136%)  for alv volume and FV curve no significant concavity with ERV  27%   - 08/12/2020  After extensive coaching inhaler device,  effectiveness =    50% from a baseline of 0 with proair device stopped up  -  08/12/2020   Walked RA  approx   500 ft  @ fast pace  stopped due to  End of study with sats 94% and min sob   - Allergy profile  08/12/20  >  Eos 0.3 /  IgE  24 - 09/08/2021  After extensive coaching inhaler device,  effectiveness =   25%  - 09/08/2021   Walked on RA x  3  lap(s) =  approx 450 @ slow pace, stopped due to end of study, min sob/"body gave out"   with lowest 02 sats 95%  - PFT's  10/29/21 FEV1 1.30 (69 % ) ratio 0.87  p 0 % improvement from saba p 0 prior to study with DLCO  6.81 (37%) corrects to 3.78 (86%)  for alv volume and FV curve min concavity  And ERV 33% at wt 200 -  I personally reviewed images and agree with radiology impression as follows:   Chest CTa 12/11/21  No emboli.  Masslike consolidation inferior lingula may reflect pneumonia or structural lesion including neoplasm. Prior studies are not available. Short-term follow-up in 4-6 weeks recommended to determine if this is a fixed lesion >> f/u CT s contrast:  04/15/22 1. Multiple peripheral rounded opacities are again seen in the lungs bilaterally measuring up to 2.7 cm. These have minimally decreased in size from 2020 - 06/06/2023  After extensive coaching inhaler device,  effectiveness =  50% from baseline near 0  > try Air Supra via [facility] - cxr 06/06/2023 improved aeration both bases  06/06/2023   Walked on RA  x approx 75-100  ft  @ mod pace, stopped due to knee in brace  with lowest 02 sats 95%    - 10/03/2023  After extensive coaching inhaler device,  effectiveness =  25% from a baseline of nearly 0 - 10/03/2023   Walked on RA  x   3  lap(s) =  approx 450  ft  @ mod pace, stopped due to end of study  with lowest 02 sats 96% and sob on last lap   Most striking finding on exam today is prominent pseudowheeze which may be related to gerd or RA related cricoartenoiditis > try first max rx gerd and f/u in 6 weeks   Each maintenance medication was reviewed in detail including emphasizing most importantly the difference between maintenance and prns and under what circumstances the prns are to be triggered using an action plan format where appropriate.  Total time for H and P, chart review, counseling, reviewing hfa  device(s) , directly observing portions of ambulatory 02 saturation study/ and generating customized AVS unique to this office visit / same day charting = 34 min

## 2023-10-03 NOTE — Patient Instructions (Signed)
GERD (REFLUX)  is an extremely common cause of respiratory symptoms just like yours , many times with no obvious heartburn at all.    It can be treated with medication, but also with lifestyle changes including elevation of the head of your bed (ideally with 6 -8inch blocks under the headboard of your bed),  Smoking cessation, avoidance of late meals, excessive alcohol, and avoid fatty foods, chocolate, peppermint, colas, red wine, and acidic juices such as orange juice.  NO MINT OR MENTHOL PRODUCTS SO NO COUGH DROPS  USE SUGARLESS CANDY INSTEAD (Jolley ranchers or Stover's or Life Savers) or even ice chips will also do - the key is to swallow to prevent all throat clearing. NO OIL BASED VITAMINS - use powdered substitutes.  Avoid fish oil when coughing.   Pantoprazole (protonix) 40 mg   Take  30-60 min before first meal of the day and Pepcid (famotidine)  20 mg after supper until return to office - this is the best way to tell whether stomach acid is contributing to your problem.    For drainage / throat tickle try take CHLORPHENIRAMINE  4 mg  ("Allergy Relief" 4mg   at [place] [facility] or [facility]  should be easiest to find in the blue box usually on bottom shelf)  take   a dose or two an hour before bedtime to see if helps you sleep thru the night  Please remember to go to the  x-ray department  @  [facility] for your tests - we will call you with the results when they are available     Please schedule a follow up office visit in 6 weeks, call sooner if needed

## 2023-11-20 ENCOUNTER — Ambulatory Visit: Payer: 59 | Admitting: Internal Medicine

## 2023-12-28 ENCOUNTER — Other Ambulatory Visit: Payer: Self-pay | Admitting: Internal Medicine

## 2023-12-28 DIAGNOSIS — R0609 Other forms of dyspnea: Secondary | ICD-10-CM

## 2024-03-01 IMAGING — CT CT CHEST W/O CM
2 of 5 series · 15 of 36 positions shown, 18 images · non-contrast
Comparison: Chest x-ray 12/04/2019. chest CT 07/01/2019.

CLINICAL DATA: Dyspnea on exertion.



[Series 3: thins · axial · 0.75mm/px · z∈[+1223,+1464]mm · 12 of 397 slices shown, 15 images]
[im 27/397  mediastinal]
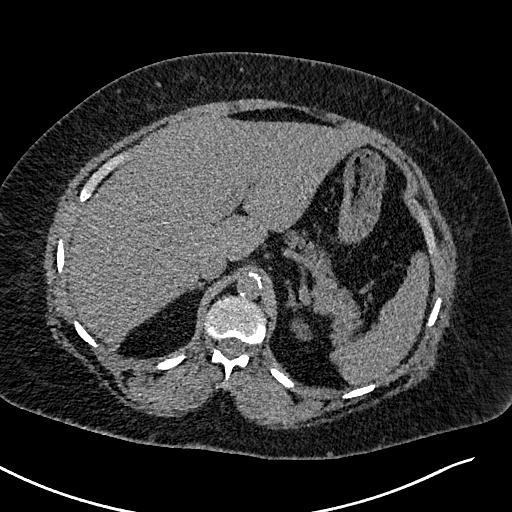
[im 27/397  lung]
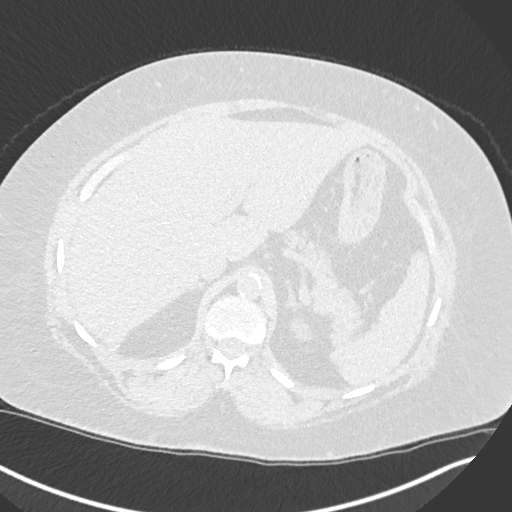
[im 53/397  lung]
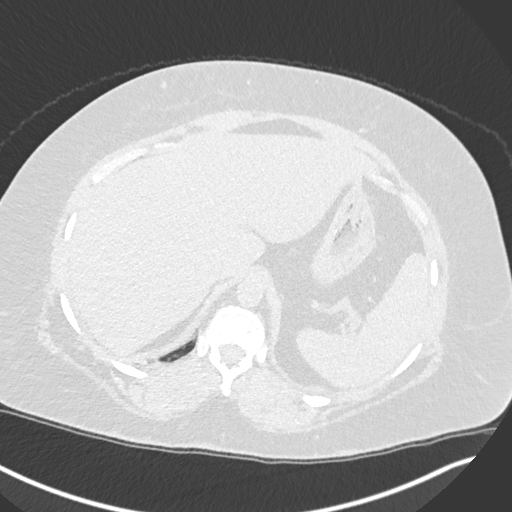
[im 80/397  lung]
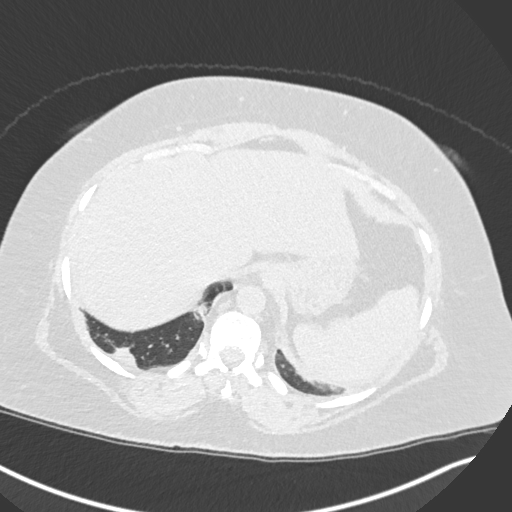
[im 133/397  lung]
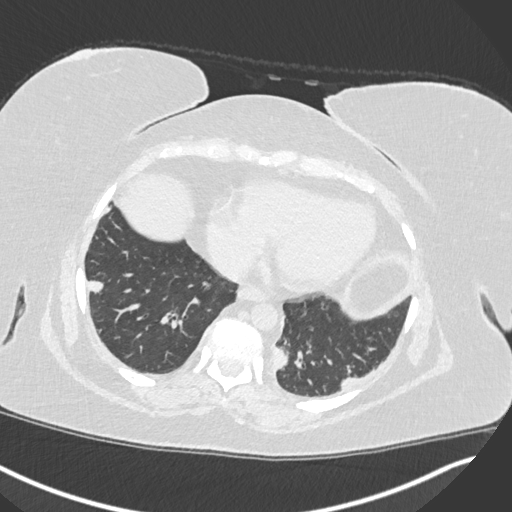
[im 159/397  mediastinal]
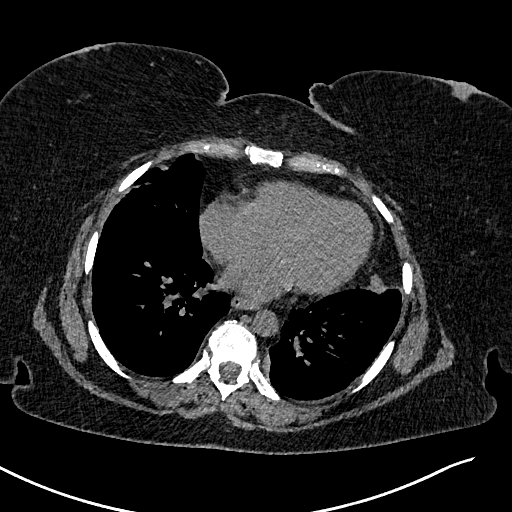
[im 159/397  lung]
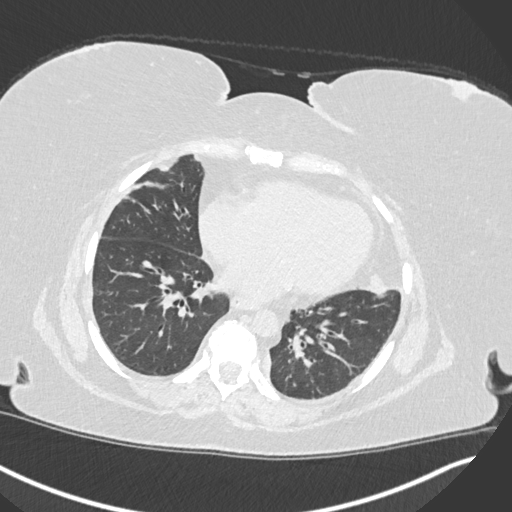
[im 185/397  lung]
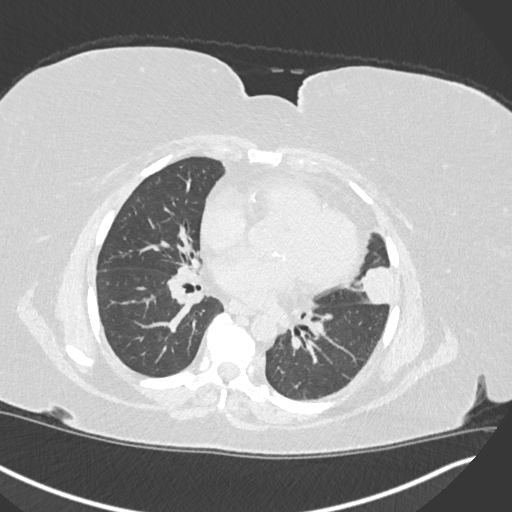
[im 212/397  lung]
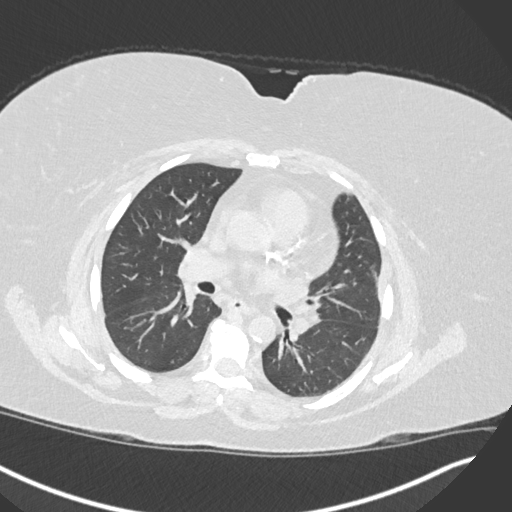
[im 238/397  lung]
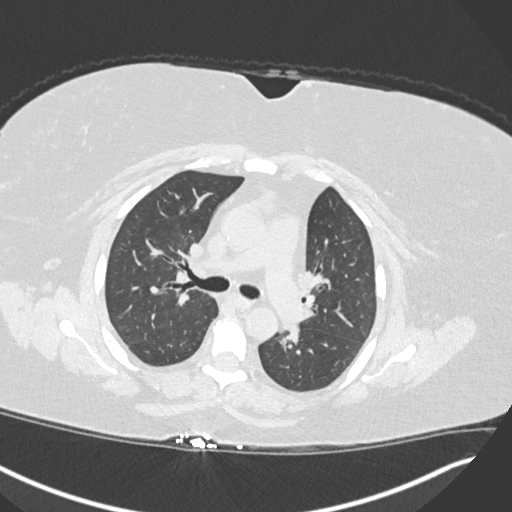
[im 265/397  mediastinal]
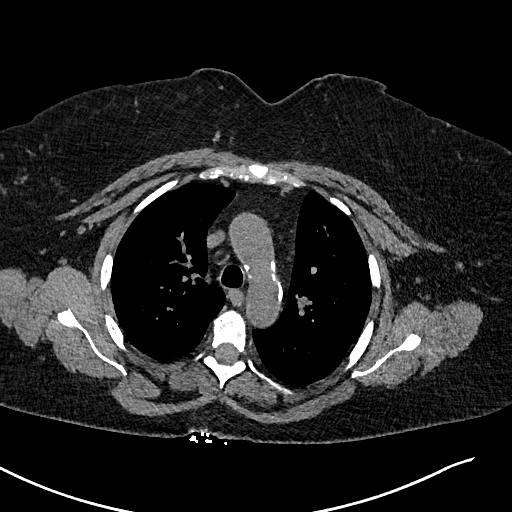
[im 265/397  lung]
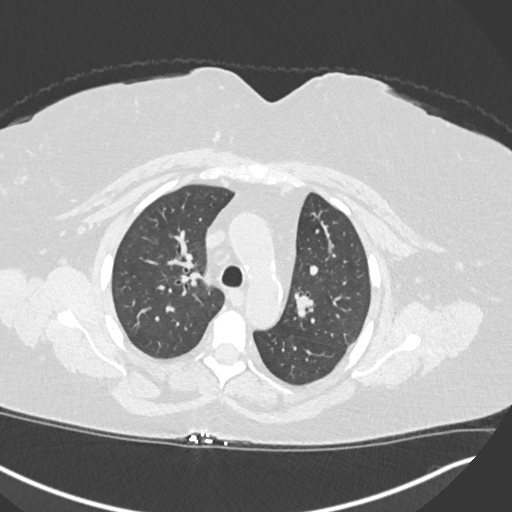
[im 317/397  lung]
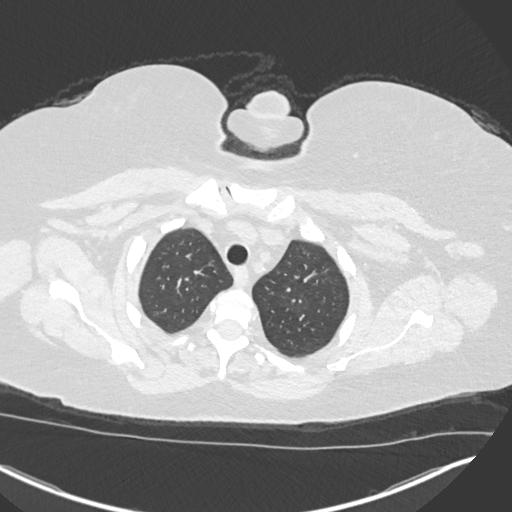
[im 344/397  lung]
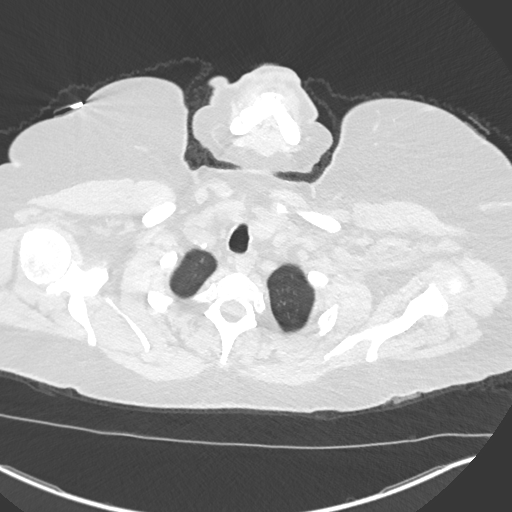
[im 370/397  lung]
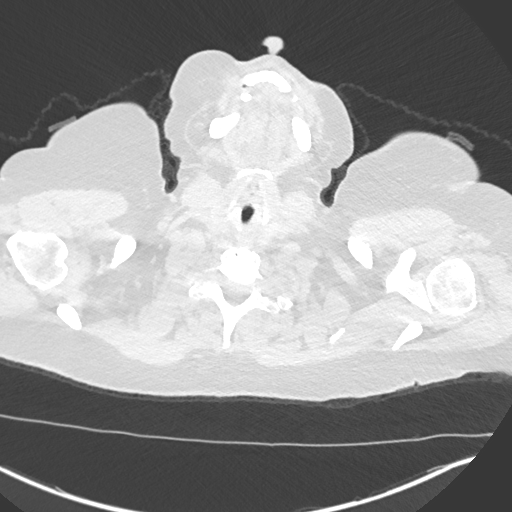

[Series 5: coronal · coronal · 0.56mm/px · 3 of 160 slices shown]
[im 32/160  lung]
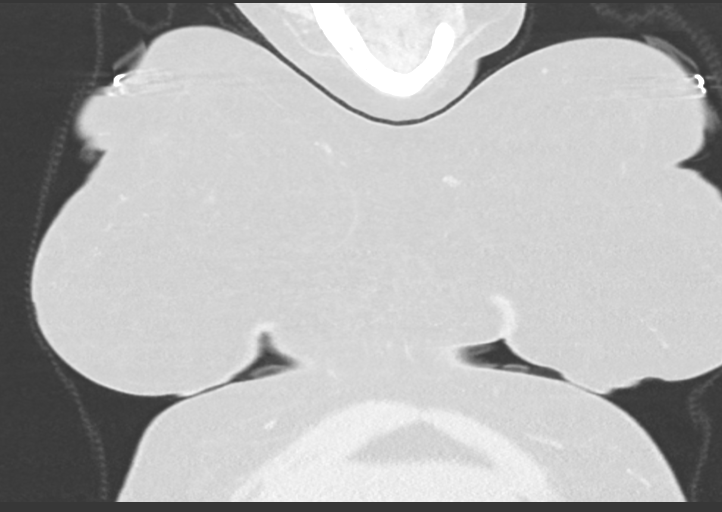
[im 64/160  lung]
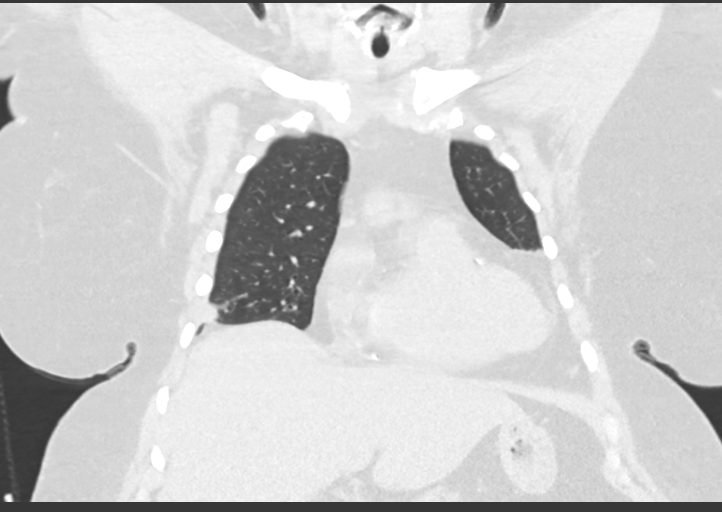
[im 96/160  lung]
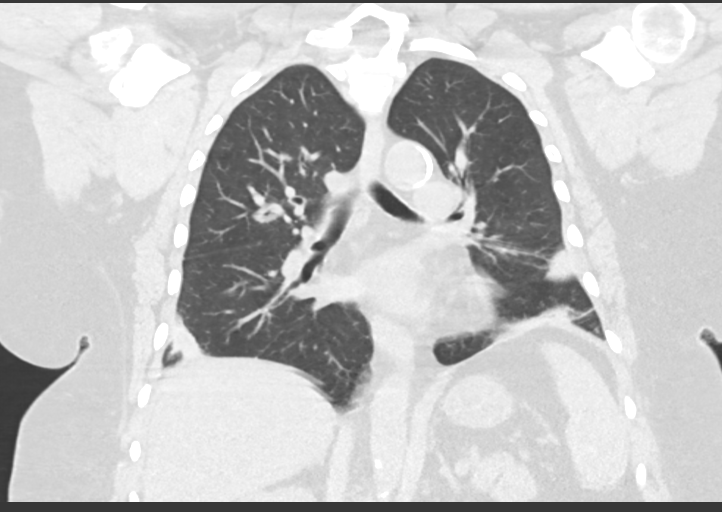

[15 of 36 positions shown; findings below may reference images not displayed]

FINDINGS: Cardiovascular: No significant vascular findings. Normal heart size.
No pericardial effusion. There are atherosclerotic calcifications of
the aorta and coronary arteries.

Mediastinum/Nodes: No enlarged mediastinal or hilar lymph nodes.
Thyroid gland, trachea, and esophagus demonstrate no significant
findings. There is a mildly enlarged left axillary lymph node
measuring 12 mm short axis.

Lungs/Pleura: Multifocal peripheral rounded airspace opacities are
again seen in the lungs bilaterally. The largest area is in the left
upper lobe measuring 2.7 by 2.2 by 2.6 cm. Compared to the prior
examination, the majority of these areas have mildly decreased size.
No new focal areas are identified. Trachea and central airways are
patent. No pneumothorax. No pleural effusion identified.

Upper Abdomen: There is a 1.8 cm cyst in the superior pole of the
left kidney.

Musculoskeletal: No acute fracture.
IMPRESSION: 1. Multiple peripheral rounded opacities are again seen in the lungs
bilaterally measuring up to 2.7 cm. These have minimally decreased
in size from 4545. Findings may be related to rounded atelectasis;
however, other etiologies can not be excluded.
2. No pleural effusion or lymphadenopathy in the chest.
3. Mildly enlarged left axillary lymph node, indeterminate.

## 2024-05-02 NOTE — H&P (Signed)
 Surgical History & Physical  Patient Name: Anita Mosley  DOB: [birth year]  Surgery: Cataract extraction with intraocular lens implant phacoemulsification; Left Eye Surgeon: Ardeth Krabbe MD Surgery Date: 05/10/2024 Pre-Op Date: 04/23/2024  HPI: A 49 Yr. old female patient presents for a cataract evaluation. Patient states that she was informed that her cataracts were ready for removal at a visit with [facility] a few months ago. Patient states that she has blurred vision. Patient states that her vision seems hazy. Patient states that she is bothered by sunlight glare during the day, and headlights at night. Patient states that her OS is worse than OD. Patient states that she struggles reading small print. Patient states that she often heistates before stepping on steps or curbs due to her vision.  Medical History:  High Blood Pressure Lung Problems  Review of Systems Cardiovascular High Blood Pressure Psychiatry Anxiety All recorded systems are negative except as noted above.  Social Never smoked   Medication Losartan, Sertraline, Folic acid, Cetirizine, Potassium chloride, Methotrexate sodium, Rosuvastatin, Pantoprazole , Hydrochlorothiazide , Atenolol   Sx/Procedures Fibroid removal, Hysterectomy, Facial Mass Removal  Drug Allergies  NKDA  History & Physical: Heent: cataracts NECK: supple without bruits LUNGS: lungs clear to auscultation CV: regular rate and rhythm Abdomen: soft and non-tender  Impression & Plan: Assessment: 1.  CATARACT NUCLEAR SCLEROSIS AGE RELATED; Both Eyes (H25.13) 2.  High Myopia; Both Eyes (H44.23)  Plan: 1.  Cataracts are visually significant and account for the patient's complaints. Discussed all risks, benefits, procedures and recovery, including infection, loss of vision and eye, need for glasses after surgery or additional procedures. Patient understands changing glasses will not improve vision. Patient indicated understanding of procedure.  All questions answered. Patient desires to have surgery, recommend phacoemulsification with intraocular lens. Patient to have preliminary testing necessary ([facility]/IOL Master, Mac OCT, TOPO) Educational materials provided:Cataract.  Plan: - Proceed with cataract surgery OS followed by OD - Plan for target-0.25 to -0.50 - no fuchs, no prior eye surgery - good dilation - Dextenza  - High Myopia  2.  limited retinal view but appears healthy

## 2024-05-08 ENCOUNTER — Encounter (HOSPITAL_COMMUNITY)
Admission: RE | Admit: 2024-05-08 | Discharge: 2024-05-08 | Disposition: A | Discharge status: Home / Self Care | Source: Ambulatory Visit | Attending: Optometry | Admitting: Optometry

## 2024-05-08 ENCOUNTER — Encounter (HOSPITAL_COMMUNITY): Payer: Self-pay

## 2024-05-10 ENCOUNTER — Encounter (HOSPITAL_COMMUNITY)
Admission: RE | Disposition: A | Discharge status: Home / Self Care | Payer: Self-pay | Source: Home / Self Care | Attending: Optometry

## 2024-05-10 ENCOUNTER — Ambulatory Visit (HOSPITAL_COMMUNITY)

## 2024-05-10 ENCOUNTER — Encounter (HOSPITAL_COMMUNITY): Payer: Self-pay | Admitting: Optometry

## 2024-05-10 ENCOUNTER — Other Ambulatory Visit: Payer: Self-pay

## 2024-05-10 ENCOUNTER — Ambulatory Visit (HOSPITAL_COMMUNITY)
Admission: RE | Admit: 2024-05-10 | Discharge: 2024-05-10 | Disposition: A | Discharge status: Home / Self Care | Attending: Optometry | Admitting: Optometry

## 2024-05-10 DIAGNOSIS — H4423 Degenerative myopia, bilateral: Secondary | ICD-10-CM | POA: Diagnosis not present

## 2024-05-10 DIAGNOSIS — I10 Essential (primary) hypertension: Secondary | ICD-10-CM | POA: Diagnosis present

## 2024-05-10 DIAGNOSIS — H2513 Age-related nuclear cataract, bilateral: Secondary | ICD-10-CM | POA: Diagnosis present

## 2024-05-10 DIAGNOSIS — M1990 Unspecified osteoarthritis, unspecified site: Secondary | ICD-10-CM | POA: Insufficient documentation

## 2024-05-10 DIAGNOSIS — J45909 Unspecified asthma, uncomplicated: Secondary | ICD-10-CM | POA: Insufficient documentation

## 2024-05-10 DIAGNOSIS — K219 Gastro-esophageal reflux disease without esophagitis: Secondary | ICD-10-CM | POA: Insufficient documentation

## 2024-05-10 DIAGNOSIS — H2512 Age-related nuclear cataract, left eye: Secondary | ICD-10-CM | POA: Diagnosis not present

## 2024-05-10 HISTORY — PX: CATARACT EXTRACTION W/PHACO: SHX586

## 2024-05-10 SURGERY — PHACOEMULSIFICATION, CATARACT, WITH IOL INSERTION
Anesthesia: Monitor Anesthesia Care | Site: Eye | Laterality: Left

## 2024-05-10 MED ORDER — LACTATED RINGERS IV SOLN: INTRAVENOUS | Status: DC

## 2024-05-10 MED ORDER — LIDOCAINE HCL 3.5 % OP GEL
1.0000 | Freq: Once | OPHTHALMIC | Status: AC
  Administered 2024-05-10: 1 via OPHTHALMIC

## 2024-05-10 MED ORDER — TETRACAINE HCL 0.5 % OP SOLN
1.0000 [drp] | OPHTHALMIC | Status: AC | PRN
  Administered 2024-05-10 (×3): 1 [drp] via OPHTHALMIC

## 2024-05-10 MED ORDER — MIDAZOLAM HCL 2 MG/2ML IJ SOLN
INTRAMUSCULAR | Status: DC | PRN
  Administered 2024-05-10 (×2): 1 mg via INTRAVENOUS

## 2024-05-10 MED ORDER — MOXIFLOXACIN HCL 5 MG/ML IO SOLN
INTRAOCULAR | Status: DC | PRN
  Administered 2024-05-10: 3 mL via OPHTHALMIC

## 2024-05-10 MED ORDER — PHENYLEPHRINE HCL 2.5 % OP SOLN
1.0000 [drp] | OPHTHALMIC | Status: AC | PRN
  Administered 2024-05-10 (×3): 1 [drp] via OPHTHALMIC

## 2024-05-10 MED ORDER — POVIDONE-IODINE 5 % OP SOLN
OPHTHALMIC | Status: DC | PRN
  Administered 2024-05-10: 1 via OPHTHALMIC

## 2024-05-10 MED ORDER — PHENYLEPHRINE-KETOROLAC 1-0.3 % IO SOLN
INTRAOCULAR | Status: DC | PRN
  Administered 2024-05-10: 500 mL via OPHTHALMIC

## 2024-05-10 MED ORDER — SODIUM CHLORIDE 0.9% FLUSH
INTRAVENOUS | Status: DC | PRN
Start: 2024-05-10 — End: 2024-05-10
  Administered 2024-05-10 (×2): 3 mL via INTRAVENOUS

## 2024-05-10 MED ORDER — TROPICAMIDE 1 % OP SOLN
1.0000 [drp] | OPHTHALMIC | Status: AC | PRN
  Administered 2024-05-10 (×3): 1 [drp] via OPHTHALMIC

## 2024-05-10 MED ORDER — LIDOCAINE HCL (PF) 1 % IJ SOLN
INTRAMUSCULAR | Status: DC | PRN
  Administered 2024-05-10: 1 mL

## 2024-05-10 MED ORDER — BSS IO SOLN
INTRAOCULAR | Status: DC | PRN
  Administered 2024-05-10: 15 mL via INTRAOCULAR

## 2024-05-10 MED ORDER — MIDAZOLAM HCL 2 MG/2ML IJ SOLN
INTRAMUSCULAR | Status: AC
  Filled 2024-05-10: qty 2

## 2024-05-10 MED ORDER — TETRACAINE 0.5 % OP SOLN OPTIME - NO CHARGE
OPHTHALMIC | Status: DC | PRN
  Administered 2024-05-10: 1 [drp] via OPHTHALMIC

## 2024-05-10 MED ORDER — SIGHTPATH DOSE#1 NA HYALUR & NA CHOND-NA HYALUR IO KIT
PACK | INTRAOCULAR | Status: DC | PRN
  Administered 2024-05-10: 1 via OPHTHALMIC

## 2024-05-10 MED ORDER — STERILE WATER FOR IRRIGATION IR SOLN
Status: DC | PRN
Start: 2024-05-10 — End: 2024-05-10
  Administered 2024-05-10: 25 mL

## 2024-05-10 SURGICAL SUPPLY — 14 items
CATARACT SUITE SIGHTPATH (MISCELLANEOUS) ×1 IMPLANT
CLOTH BEACON ORANGE TIMEOUT ST (SAFETY) ×1 IMPLANT
DRSG TEGADERM 4X4.75 (GAUZE/BANDAGES/DRESSINGS) ×1 IMPLANT
EYE SHIELD UNIVERSAL CLEAR (GAUZE/BANDAGES/DRESSINGS) IMPLANT
FEE CATARACT SUITE SIGHTPATH (MISCELLANEOUS) ×1 IMPLANT
GLOVE BIOGEL PI IND STRL 7.0 (GLOVE) ×2 IMPLANT
LENS IOL TECNIS EYHANCE 6.0 (Intraocular Lens) IMPLANT
NDL HYPO 18GX1.5 BLUNT FILL (NEEDLE) ×1 IMPLANT
NEEDLE HYPO 18GX1.5 BLUNT FILL (NEEDLE) ×1 IMPLANT
PAD ARMBOARD POSITIONER FOAM (MISCELLANEOUS) ×1 IMPLANT
POSITIONER HEAD 8X9X4 ADT (SOFTGOODS) ×1 IMPLANT
SYR TB 1ML LL NO SAFETY (SYRINGE) ×1 IMPLANT
TAPE SURG TRANSPORE 1 IN (GAUZE/BANDAGES/DRESSINGS) IMPLANT
WATER STERILE IRR 250ML POUR (IV SOLUTION) ×1 IMPLANT

## 2024-05-10 NOTE — Anesthesia Postprocedure Evaluation (Signed)
 Anesthesia Post Note  Patient: Anita Mosley  Procedure(s) Performed: PHACOEMULSIFICATION, CATARACT, WITH IOL INSERTION (Left: Eye)  Patient location during evaluation: PACU Anesthesia Type: MAC Level of consciousness: awake and alert Pain management: pain level controlled Vital Signs Assessment: post-procedure vital signs reviewed and stable Respiratory status: spontaneous breathing, nonlabored ventilation, respiratory function stable and patient connected to nasal cannula oxygen Cardiovascular status: stable and blood pressure returned to baseline Postop Assessment: no apparent nausea or vomiting Anesthetic complications: no  No notable events documented.   Last Vitals:  Vitals:   05/10/24 0903 05/10/24 0944  BP: (!) 150/69 (!) 143/67  Pulse:  72  Resp:  (!) 23  Temp:  (!) 36.4 C  SpO2:  100%    Last Pain:  Vitals:   05/10/24 0944  TempSrc: Oral  PainSc: 0-No pain                 Beacher Limerick

## 2024-05-10 NOTE — Interval H&P Note (Signed)
 History and Physical Interval Note:  05/10/2024 9:15 AM  The H and P was reviewed and updated. The patient was examined.  No changes were found after exam.  The surgical eye was marked.  Anita Mosley

## 2024-05-10 NOTE — Discharge Instructions (Signed)
 Please discharge patient when stable, will follow up today with Dr. Ilsa Iha at the [facility] office immediately following discharge.  Leave shield in place until visit.  All paperwork with discharge instructions will be given at the office.  [facility] Address:  [address]  [place], [place] [address]  Dr. Chaya Jan Phone: [phone]

## 2024-05-10 NOTE — Transfer of Care (Signed)
 Immediate Anesthesia Transfer of Care Note  Patient: Anita Mosley  Procedure(s) Performed: PHACOEMULSIFICATION, CATARACT, WITH IOL INSERTION (Left: Eye)  Patient Location: Short Stay  Anesthesia Type:MAC  Level of Consciousness: awake, alert , and oriented  Airway & Oxygen Therapy: Patient Spontanous Breathing  Post-op Assessment: Report given to RN and Post -op Vital signs reviewed and stable  Post vital signs: Reviewed and stable  Last Vitals:  Vitals Value Taken Time  BP    Temp    Pulse    Resp    SpO2      Last Pain:  Vitals:   05/10/24 0857  TempSrc: Oral  PainSc: 0-No pain      Patients Stated Pain Goal: 6 (05/10/24 0857)  Complications: No notable events documented.

## 2024-05-10 NOTE — Anesthesia Procedure Notes (Signed)
 Date/Time: 05/10/2024 9:21 AM  Performed by: Verline Glow, CRNAPre-anesthesia Checklist: Patient identified, Emergency Drugs available, Suction available, Timeout performed and Patient being monitored Patient Re-evaluated:Patient Re-evaluated prior to induction Oxygen Delivery Method: Nasal Cannula

## 2024-05-10 NOTE — Anesthesia Preprocedure Evaluation (Addendum)
 Anesthesia Evaluation  Patient identified by MRN, date of birth, ID band Patient awake    Reviewed: Allergy & Precautions, H&P , NPO status , Patient's Chart, lab work & pertinent test results  Airway Mallampati: II  TM Distance: >3 FB Neck ROM: Full    Dental no notable dental hx.    Pulmonary asthma    Pulmonary exam normal breath sounds clear to auscultation       Cardiovascular hypertension, + DOE  Normal cardiovascular exam Rhythm:Regular Rate:Normal     Neuro/Psych negative neurological ROS  negative psych ROS   GI/Hepatic Neg liver ROS,GERD  ,,  Endo/Other  negative endocrine ROS    Renal/GU negative Renal ROS  negative genitourinary   Musculoskeletal  (+) Arthritis ,    Abdominal   Peds negative pediatric ROS (+)  Hematology  (+) Blood dyscrasia, anemia   Anesthesia Other Findings   Reproductive/Obstetrics negative OB ROS                             Anesthesia Physical Anesthesia Plan  ASA: 2  Anesthesia Plan: MAC   Post-op Pain Management:    Induction: Intravenous  PONV Risk Score and Plan: Treatment may vary due to age or medical condition  Airway Management Planned: Nasal Cannula  Additional Equipment:   Intra-op Plan:   Post-operative Plan:   Informed Consent: I have reviewed the patients History and Physical, chart, labs and discussed the procedure including the risks, benefits and alternatives for the proposed anesthesia with the patient or authorized representative who has indicated his/her understanding and acceptance.     Dental advisory given  Plan Discussed with: CRNA  Anesthesia Plan Comments:        Anesthesia Quick Evaluation

## 2024-05-10 NOTE — Op Note (Signed)
 Date of procedure: 05/10/24  Pre-operative diagnosis: Visually significant age-related nuclear cataract, Left Eye (H25.12)  Post-operative diagnosis: Visually significant age-related nuclear cataract, Left Eye H25.12; Ocular Pain and Inflammation  Procedure: Removal of cataract via phacoemulsification and insertion of intra-ocular lens J&J DIB00 +6.0D into the capsular bag of the Left Eye  Attending surgeon: Gale Jude, MD  Anesthesia: MAC, Topical Akten  Complications: None  Estimated Blood Loss: <64mL (minimal)  Specimens: None  Implants:  Implant Name Type Inv. Item Serial No. Manufacturer Lot No. LRB No. Used Action  LENS IOL TECNIS EYHANCE 6.0 - [id number] Intraocular Lens LENS IOL TECNIS EYHANCE 6.0 [id number] [employer]  Left 1 Implanted    Indications:  Visually significant age-related cataract, Left Eye  Procedure:  The patient was seen and identified in the pre-operative area. The operative eye was identified and dilated.  The operative eye was marked.  Topical anesthesia was administered to the operative eye.     The patient was then to the operative suite and placed in the supine position.  A timeout was performed confirming the patient, procedure to be performed, and all other relevant information.   The patient's face was prepped and draped in the usual fashion for intra-ocular surgery.  A lid speculum was placed into the operative eye and the surgical microscope moved into place and focused.  An inferotemporal paracentesis was created using a 20 gauge paracentesis blade.  BSS mixed with Omidria, followed by 1% lidocaine  was injected into the anterior chamber.  Viscoelastic was injected into the anterior chamber.  A temporal clear-corneal main wound incision was created using a 2.24mm microkeratome.  A continuous curvilinear capsulorrhexis was initiated using an irrigating cystitome and completed using capsulorrhexis forceps.  Hydrodissection and hydrodeliniation were  performed.  Viscoelastic was injected into the anterior chamber.  A phacoemulsification handpiece and a chopper as a second instrument were used to remove the nucleus and epinucleus. The irrigation/aspiration handpiece was used to remove any remaining cortical material.   The capsular bag was reinflated with viscoelastic, checked, and found to be intact.  The intraocular lens was inserted into the capsular bag.  The irrigation/aspiration handpiece was used to remove any remaining viscoelastic.  The clear corneal wound and paracentesis wounds were then hydrated and checked with Weck-Cels to be watertight. Moxifloxacin was instilled into the anterior chamber.  The lid-speculum and drape were removed. The patient's face was cleaned with a wet and dry 4x4.  A clear shield was taped over the eye. The patient was taken to the post-operative care unit in good condition, having tolerated the procedure well.  Post-Op Instructions: The patient will follow up at [facility] for a same day post-operative evaluation and will receive all other orders and instructions.

## 2024-05-14 ENCOUNTER — Encounter (HOSPITAL_COMMUNITY): Payer: Self-pay | Admitting: Optometry

## 2024-05-22 NOTE — H&P (Signed)
 Surgical History & Physical  Patient Name: Anita Mosley  DOB: [birth year]  Surgery: Cataract extraction with intraocular lens implant phacoemulsification; Right Eye Surgeon: Ardeth Krabbe MD Surgery Date: 05/31/2024 Pre-Op Date: 05/21/2024  HPI: A 29 Yr. old female patient present for 11 day post op OS. Patient states OS hurts in the morning, red, and light sensitive. Using combo gtt TID and shakes every time OS. Patient is having trouble with all daily activities, reading, watching TV, driving, all due to the cataract OD. This is negatively affecting the patient's quality of life and the patient is unable to function adequately in life with the current level of vision. Patient would like to proceed with cataract surgery OD.  Medical History:  High Blood Pressure Lung Problems  Review of Systems Cardiovascular High Blood Pressure Psychiatry Anxiety All recorded systems are negative except as noted above.  Social Never smoked   Medication Prednisolone-moxiflox-bromfen,  Losartan, Sertraline, Folic acid, Cetirizine, Potassium chloride, Methotrexate sodium, Rosuvastatin, Pantoprazole , Hydrochlorothiazide , Atenolol   Sx/Procedures Phaco c IOL OS,  Fibroid removal, Hysterectomy, Facial Mass Removal  Drug Allergies  NKDA  History & Physical: Heent: cataracts NECK: supple without bruits LUNGS: lungs clear to auscultation CV: regular rate and rhythm Abdomen: soft and non-tender  Impression & Plan: Assessment: 1.  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  CATARACT EXTRACTION STATUS; Left Eye (Z98.42) 3.  INTRAOCULAR LENS IOL (Z96.1)  Plan: 1.  Cataracts are visually significant and account for the patient's complaints. Discussed all risks, benefits, procedures and recovery, including infection, loss of vision and eye, need for glasses after surgery or additional procedures. Patient understands changing glasses will not improve vision. Patient indicated  understanding of procedure. All questions answered. Patient desires to have surgery, recommend phacoemulsification with intraocular lens. Patient to have preliminary testing necessary ([employer]/IOL Master, Mac OCT, TOPO) Educational materials provided:Cataract.  Plan: - Proceed with cataract surgery OD when ready - Plan for target-0.25 to -0.50 - no fuchs, no prior eye surgery - good dilation - Dextenza  - High Myopia with astigmatism  2.  POD11. Doing well. All post-op precautions discussed and instructions reviewed. Written instructions given.  Still with some AC cell and photophobia. Continue gtts for 3 total weeks. Slightly myopic OS which was goal  3. See above

## 2024-05-28 ENCOUNTER — Encounter (HOSPITAL_COMMUNITY): Payer: Self-pay

## 2024-05-28 ENCOUNTER — Encounter (HOSPITAL_COMMUNITY)
Admission: RE | Admit: 2024-05-28 | Discharge: 2024-05-28 | Disposition: A | Discharge status: Home / Self Care | Source: Ambulatory Visit | Attending: Optometry | Admitting: Optometry

## 2024-05-28 NOTE — Pre-Procedure Instructions (Signed)
 Attempted pre-op phone call. Left Vm for Cydney Draft- pt's contact and niece to call us  back.

## 2024-05-30 NOTE — Anesthesia Preprocedure Evaluation (Addendum)
 Anesthesia Evaluation  Patient identified by MRN, date of birth, ID band Patient awake    Reviewed: Allergy & Precautions, H&P , NPO status , Patient's Chart, lab work & pertinent test results  Airway Mallampati: II  TM Distance: >3 FB Neck ROM: Full    Dental no notable dental hx. (+) Dental Advisory Given, Teeth Intact   Pulmonary asthma    Pulmonary exam normal breath sounds clear to auscultation       Cardiovascular hypertension, + DOE  Normal cardiovascular exam Rhythm:Regular Rate:Normal     Neuro/Psych negative neurological ROS  negative psych ROS   GI/Hepatic Neg liver ROS,GERD  ,,  Endo/Other  negative endocrine ROS    Renal/GU negative Renal ROS  negative genitourinary   Musculoskeletal  (+) Arthritis ,    Abdominal   Peds negative pediatric ROS (+)  Hematology   Anesthesia Other Findings   Reproductive/Obstetrics negative OB ROS                             Anesthesia Physical Anesthesia Plan  ASA: 2  Anesthesia Plan: MAC   Post-op Pain Management:    Induction: Intravenous  PONV Risk Score and Plan:   Airway Management Planned: Nasal Cannula and Natural Airway  Additional Equipment: None  Intra-op Plan:   Post-operative Plan:   Informed Consent: I have reviewed the patients History and Physical, chart, labs and discussed the procedure including the risks, benefits and alternatives for the proposed anesthesia with the patient or authorized representative who has indicated his/her understanding and acceptance.     Dental advisory given  Plan Discussed with: CRNA  Anesthesia Plan Comments:        Anesthesia Quick Evaluation

## 2024-05-31 ENCOUNTER — Encounter (HOSPITAL_COMMUNITY): Payer: Self-pay | Admitting: Optometry

## 2024-05-31 ENCOUNTER — Other Ambulatory Visit: Payer: Self-pay

## 2024-05-31 ENCOUNTER — Ambulatory Visit (HOSPITAL_COMMUNITY)
Admission: RE | Admit: 2024-05-31 | Discharge: 2024-05-31 | Disposition: A | Discharge status: Home / Self Care | Attending: Optometry | Admitting: Optometry

## 2024-05-31 ENCOUNTER — Ambulatory Visit (HOSPITAL_COMMUNITY): Payer: Self-pay | Admitting: Anesthesiology

## 2024-05-31 ENCOUNTER — Encounter (HOSPITAL_COMMUNITY)
Admission: RE | Disposition: A | Discharge status: Home / Self Care | Payer: Self-pay | Source: Home / Self Care | Attending: Optometry

## 2024-05-31 DIAGNOSIS — I10 Essential (primary) hypertension: Secondary | ICD-10-CM | POA: Insufficient documentation

## 2024-05-31 DIAGNOSIS — Z9842 Cataract extraction status, left eye: Secondary | ICD-10-CM | POA: Insufficient documentation

## 2024-05-31 DIAGNOSIS — H2511 Age-related nuclear cataract, right eye: Secondary | ICD-10-CM | POA: Diagnosis present

## 2024-05-31 DIAGNOSIS — H5213 Myopia, bilateral: Secondary | ICD-10-CM | POA: Insufficient documentation

## 2024-05-31 DIAGNOSIS — K219 Gastro-esophageal reflux disease without esophagitis: Secondary | ICD-10-CM | POA: Insufficient documentation

## 2024-05-31 DIAGNOSIS — J45909 Unspecified asthma, uncomplicated: Secondary | ICD-10-CM | POA: Insufficient documentation

## 2024-05-31 DIAGNOSIS — H52201 Unspecified astigmatism, right eye: Secondary | ICD-10-CM | POA: Insufficient documentation

## 2024-05-31 DIAGNOSIS — Z961 Presence of intraocular lens: Secondary | ICD-10-CM | POA: Insufficient documentation

## 2024-05-31 DIAGNOSIS — M1990 Unspecified osteoarthritis, unspecified site: Secondary | ICD-10-CM | POA: Diagnosis not present

## 2024-05-31 HISTORY — PX: CATARACT EXTRACTION W/PHACO: SHX586

## 2024-05-31 SURGERY — PHACOEMULSIFICATION, CATARACT, WITH IOL INSERTION
Anesthesia: Monitor Anesthesia Care | Site: Eye | Laterality: Right

## 2024-05-31 MED ORDER — POVIDONE-IODINE 5 % OP SOLN
OPHTHALMIC | Status: DC | PRN
  Administered 2024-05-31: 1 via OPHTHALMIC

## 2024-05-31 MED ORDER — SODIUM CHLORIDE 0.9% FLUSH
INTRAVENOUS | Status: DC | PRN
Start: 2024-05-31 — End: 2024-05-31
  Administered 2024-05-31: 5 mL via INTRAVENOUS

## 2024-05-31 MED ORDER — PHENYLEPHRINE HCL 2.5 % OP SOLN
1.0000 [drp] | OPHTHALMIC | Status: AC
  Administered 2024-05-31 (×3): 1 [drp] via OPHTHALMIC

## 2024-05-31 MED ORDER — LIDOCAINE HCL 3.5 % OP GEL
1.0000 | Freq: Once | OPHTHALMIC | Status: AC
  Administered 2024-05-31: 1 via OPHTHALMIC

## 2024-05-31 MED ORDER — TETRACAINE HCL 0.5 % OP SOLN
1.0000 [drp] | OPHTHALMIC | Status: AC
  Administered 2024-05-31 (×3): 1 [drp] via OPHTHALMIC

## 2024-05-31 MED ORDER — LIDOCAINE HCL (PF) 1 % IJ SOLN
INTRAMUSCULAR | Status: DC | PRN
  Administered 2024-05-31: 1 mL

## 2024-05-31 MED ORDER — MIDAZOLAM HCL 2 MG/2ML IJ SOLN
INTRAMUSCULAR | Status: DC | PRN
  Administered 2024-05-31: 2 mg via INTRAVENOUS

## 2024-05-31 MED ORDER — MOXIFLOXACIN HCL 5 MG/ML IO SOLN
INTRAOCULAR | Status: DC | PRN
  Administered 2024-05-31: 3 mL via INTRACAMERAL

## 2024-05-31 MED ORDER — PHENYLEPHRINE-KETOROLAC 1-0.3 % IO SOLN
INTRAOCULAR | Status: DC | PRN
  Administered 2024-05-31: 500 mL via OPHTHALMIC

## 2024-05-31 MED ORDER — TETRACAINE 0.5 % OP SOLN OPTIME - NO CHARGE
OPHTHALMIC | Status: DC | PRN
  Administered 2024-05-31: 1 [drp] via OPHTHALMIC

## 2024-05-31 MED ORDER — SIGHTPATH DOSE#1 NA HYALUR & NA CHOND-NA HYALUR IO KIT
PACK | INTRAOCULAR | Status: DC | PRN
  Administered 2024-05-31: 1 via OPHTHALMIC

## 2024-05-31 MED ORDER — TROPICAMIDE 1 % OP SOLN
1.0000 [drp] | OPHTHALMIC | Status: AC
  Administered 2024-05-31 (×3): 1 [drp] via OPHTHALMIC

## 2024-05-31 MED ORDER — MIDAZOLAM HCL 2 MG/2ML IJ SOLN
INTRAMUSCULAR | Status: AC
  Filled 2024-05-31: qty 2

## 2024-05-31 MED ORDER — STERILE WATER FOR IRRIGATION IR SOLN
Status: DC | PRN
  Administered 2024-05-31: 250 mL

## 2024-05-31 MED ORDER — BSS IO SOLN
INTRAOCULAR | Status: DC | PRN
  Administered 2024-05-31: 15 mL via INTRAOCULAR

## 2024-05-31 MED ORDER — LACTATED RINGERS IV SOLN: INTRAVENOUS | Status: DC

## 2024-05-31 SURGICAL SUPPLY — 14 items
CATARACT SUITE SIGHTPATH (MISCELLANEOUS) ×1 IMPLANT
CLOTH BEACON ORANGE TIMEOUT ST (SAFETY) ×1 IMPLANT
DRSG TEGADERM 4X4.75 (GAUZE/BANDAGES/DRESSINGS) ×1 IMPLANT
EYE SHIELD UNIVERSAL CLEAR (GAUZE/BANDAGES/DRESSINGS) IMPLANT
FEE CATARACT SUITE SIGHTPATH (MISCELLANEOUS) ×1 IMPLANT
GLOVE BIOGEL PI IND STRL 7.0 (GLOVE) ×2 IMPLANT
LENS IOL TECNIS EYHANCE 8.0 (Intraocular Lens) IMPLANT
NDL HYPO 18GX1.5 BLUNT FILL (NEEDLE) ×1 IMPLANT
NEEDLE HYPO 18GX1.5 BLUNT FILL (NEEDLE) ×1 IMPLANT
PAD ARMBOARD POSITIONER FOAM (MISCELLANEOUS) ×1 IMPLANT
POSITIONER HEAD 8X9X4 ADT (SOFTGOODS) ×1 IMPLANT
SYR TB 1ML LL NO SAFETY (SYRINGE) ×1 IMPLANT
TAPE SURG TRANSPORE 1 IN (GAUZE/BANDAGES/DRESSINGS) IMPLANT
WATER STERILE IRR 250ML POUR (IV SOLUTION) ×1 IMPLANT

## 2024-05-31 NOTE — Anesthesia Postprocedure Evaluation (Signed)
 Anesthesia Post Note  Patient: VERINA GALENO  Procedure(s) Performed: PHACOEMULSIFICATION, CATARACT, WITH IOL INSERTION (Right: Eye)  Patient location during evaluation: Phase II Anesthesia Type: MAC Level of consciousness: awake and alert Pain management: pain level controlled Vital Signs Assessment: post-procedure vital signs reviewed and stable Respiratory status: spontaneous breathing, nonlabored ventilation and respiratory function stable Cardiovascular status: stable and blood pressure returned to baseline Postop Assessment: no apparent nausea or vomiting Anesthetic complications: no   There were no known notable events for this encounter.   Last Vitals:  Vitals:   05/31/24 0720 05/31/24 0812  BP: (!) 150/73 126/65  Pulse: 76 73  Resp: 18 18  Temp: 36.6 C 36.5 C  SpO2: 99% 100%    Last Pain:  Vitals:   05/31/24 0812  TempSrc: Oral  PainSc: 0-No pain                 Shanda Cadotte L Raychelle Hudman

## 2024-05-31 NOTE — Transfer of Care (Signed)
 Immediate Anesthesia Transfer of Care Note  Patient: Anita Mosley  Procedure(s) Performed: PHACOEMULSIFICATION, CATARACT, WITH IOL INSERTION (Right: Eye)  Patient Location: Short Stay  Anesthesia Type:MAC  Level of Consciousness: awake, alert , and oriented  Airway & Oxygen Therapy: Patient Spontanous Breathing  Post-op Assessment: Report given to RN and Post -op Vital signs reviewed and stable  Post vital signs: Reviewed and stable  Last Vitals:  Vitals Value Taken Time  BP 126/65 05/31/24 08:12  Temp 36.5 C 05/31/24 08:12  Pulse 73 05/31/24 08:12  Resp 18 05/31/24 08:12  SpO2 100 % 05/31/24 08:12    Last Pain:  Vitals:   05/31/24 0812  TempSrc: Oral  PainSc: 0-No pain      Patients Stated Pain Goal: 6 (05/31/24 0981)  Complications: No notable events documented.

## 2024-05-31 NOTE — Op Note (Signed)
 Date of procedure: 05/31/24  Pre-operative diagnosis: Visually significant age-related nuclear cataract, Right Eye (H25.11)  Post-operative diagnosis: Visually significant age-related nuclear cataract, Right Eye H25.11  Procedure: Removal of cataract via phacoemulsification and insertion of intra-ocular lens J&J DIBOO +8.0D into the capsular bag of the Right Eye  Attending surgeon: Ardeth Krabbe, MD  Anesthesia: MAC, Topical Akten   Complications: None  Estimated Blood Loss: <27mL (minimal)  Specimens: None  Implants:  Implant Name Type Inv. Item Serial No. Manufacturer Lot No. LRB No. Used Action  Tecnis Eyhance IOL Intraocular Lens  [id number] ALCON  Right 1 Implanted    Indications:  Visually significant age-related cataract, Right Eye  Procedure:  The patient was seen and identified in the pre-operative area. The operative eye was identified and dilated.  The operative eye was marked.  Topical anesthesia was administered to the operative eye.     The patient was then to the operative suite and placed in the supine position.  A timeout was performed confirming the patient, procedure to be performed, and all other relevant information.   The patient's face was prepped and draped in the usual fashion for intra-ocular surgery.  A lid speculum was placed into the operative eye and the surgical microscope moved into place and focused.  A superotemporal paracentesis was created using a 20 gauge paracentesis blade.  BSS mixed with Omidria , followed by 1% lidocaine  was injected into the anterior chamber.  Viscoelastic was injected into the anterior chamber.  A temporal clear-corneal main wound incision was created using a 2.77mm microkeratome.  A continuous curvilinear capsulorrhexis was initiated using an irrigating cystitome and completed using capsulorrhexis forceps.  Hydrodissection and hydrodeliniation were performed.  Viscoelastic was injected into the anterior chamber.  A  phacoemulsification handpiece and a chopper as a second instrument were used to remove the nucleus and epinucleus. The irrigation/aspiration handpiece was used to remove any remaining cortical material.   The capsular bag was reinflated with viscoelastic, checked, and found to be intact.  The intraocular lens was inserted into the capsular bag.  The irrigation/aspiration handpiece was used to remove any remaining viscoelastic.  The clear corneal wound and paracentesis wounds were then hydrated and checked with Weck-Cels to be watertight. Moxifloxacin  was instilled into the anterior chamber.  The lid-speculum and drape were removed. The patient's face was cleaned with a wet and dry 4x4. A clear shield was taped over the eye. The patient was taken to the post-operative care unit in good condition, having tolerated the procedure well.  Post-Op Instructions: The patient will follow up at [facility] for a same day post-operative evaluation and will receive all other orders and instructions.

## 2024-05-31 NOTE — Interval H&P Note (Signed)
 History and Physical Interval Note:  05/31/2024 7:23 AM  The H and P was reviewed and updated. The patient was examined.  No changes were found after exam.  The surgical eye was marked.  Arsen Mangione

## 2024-05-31 NOTE — Discharge Instructions (Signed)
 Please discharge patient when stable, will follow up today with Dr. Ilsa Iha at the [facility] office immediately following discharge.  Leave shield in place until visit.  All paperwork with discharge instructions will be given at the office.  [facility] Address:  [address]  [place], [place] [address]  Dr. Chaya Jan Phone: [phone]

## 2024-06-03 ENCOUNTER — Encounter (HOSPITAL_COMMUNITY): Payer: Self-pay | Admitting: Optometry

## 2024-08-02 ENCOUNTER — Other Ambulatory Visit: Payer: Self-pay | Admitting: Internal Medicine
